# Patient Record
Sex: Female | Born: 1965 | Race: White | Hispanic: No | Marital: Married | Smoking: Current every day smoker
Health system: Southern US, Community
[De-identification: ages and names within clinical notes are randomized; demographics above are authoritative.]

## PROBLEM LIST (undated history)

## (undated) DIAGNOSIS — D239 Other benign neoplasm of skin, unspecified: Secondary | ICD-10-CM

## (undated) DIAGNOSIS — K635 Polyp of colon: Secondary | ICD-10-CM

## (undated) DIAGNOSIS — Z720 Tobacco use: Secondary | ICD-10-CM

## (undated) DIAGNOSIS — R002 Palpitations: Secondary | ICD-10-CM

## (undated) DIAGNOSIS — R232 Flushing: Secondary | ICD-10-CM

## (undated) DIAGNOSIS — G4730 Sleep apnea, unspecified: Secondary | ICD-10-CM

## (undated) DIAGNOSIS — K219 Gastro-esophageal reflux disease without esophagitis: Secondary | ICD-10-CM

## (undated) DIAGNOSIS — J449 Chronic obstructive pulmonary disease, unspecified: Secondary | ICD-10-CM

## (undated) HISTORY — DX: Gastro-esophageal reflux disease without esophagitis: K21.9

## (undated) HISTORY — DX: Tobacco use: Z72.0

## (undated) HISTORY — DX: Palpitations: R00.2

## (undated) HISTORY — PX: BREAST ENHANCEMENT SURGERY: SHX7

## (undated) HISTORY — DX: Sleep apnea, unspecified: G47.30

## (undated) HISTORY — DX: Other benign neoplasm of skin, unspecified: D23.9

## (undated) HISTORY — DX: Chronic obstructive pulmonary disease, unspecified: J44.9

## (undated) HISTORY — DX: Polyp of colon: K63.5

## (undated) HISTORY — PX: BILATERAL TEMPOROMANDIBULAR JOINT ARTHROPLASTY: SUR77

## (undated) HISTORY — DX: Flushing: R23.2

---
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
## 1995-07-29 HISTORY — PX: TUBAL LIGATION: SHX77

## 1998-10-09 ENCOUNTER — Other Ambulatory Visit: Admission: RE | Admit: 1998-10-09 | Discharge: 1998-10-09 | Payer: Self-pay | Admitting: Obstetrics & Gynecology

## 2000-06-15 ENCOUNTER — Other Ambulatory Visit: Admission: RE | Admit: 2000-06-15 | Discharge: 2000-06-15 | Payer: Self-pay | Admitting: Obstetrics and Gynecology

## 2000-10-27 ENCOUNTER — Other Ambulatory Visit: Admission: RE | Admit: 2000-10-27 | Discharge: 2000-10-27 | Payer: Self-pay | Admitting: Obstetrics and Gynecology

## 2001-08-28 HISTORY — PX: COLPOSCOPY: SHX161

## 2001-09-02 ENCOUNTER — Other Ambulatory Visit: Admission: RE | Admit: 2001-09-02 | Discharge: 2001-09-02 | Payer: Self-pay | Admitting: Obstetrics and Gynecology

## 2001-09-25 HISTORY — PX: CERVICAL BIOPSY  W/ LOOP ELECTRODE EXCISION: SUR135

## 2002-02-11 ENCOUNTER — Other Ambulatory Visit: Admission: RE | Admit: 2002-02-11 | Discharge: 2002-02-11 | Payer: Self-pay | Admitting: Obstetrics and Gynecology

## 2002-05-16 ENCOUNTER — Other Ambulatory Visit: Admission: RE | Admit: 2002-05-16 | Discharge: 2002-05-16 | Payer: Self-pay | Admitting: Obstetrics and Gynecology

## 2003-05-10 ENCOUNTER — Other Ambulatory Visit: Admission: RE | Admit: 2003-05-10 | Discharge: 2003-05-10 | Payer: Self-pay | Admitting: Obstetrics and Gynecology

## 2004-04-12 ENCOUNTER — Emergency Department (HOSPITAL_COMMUNITY): Admission: EM | Admit: 2004-04-12 | Discharge: 2004-04-12 | Payer: Self-pay | Admitting: Family Medicine

## 2004-05-07 ENCOUNTER — Other Ambulatory Visit: Admission: RE | Admit: 2004-05-07 | Discharge: 2004-05-07 | Payer: Self-pay | Admitting: Family Medicine

## 2004-06-04 ENCOUNTER — Ambulatory Visit: Payer: Self-pay | Admitting: Family Medicine

## 2005-07-28 HISTORY — PX: RHINOPLASTY: SUR1284

## 2005-12-02 ENCOUNTER — Ambulatory Visit: Payer: Self-pay | Admitting: Family Medicine

## 2005-12-02 ENCOUNTER — Encounter (INDEPENDENT_AMBULATORY_CARE_PROVIDER_SITE_OTHER): Payer: Self-pay | Admitting: *Deleted

## 2005-12-02 ENCOUNTER — Other Ambulatory Visit: Admission: RE | Admit: 2005-12-02 | Discharge: 2005-12-02 | Payer: Self-pay | Admitting: Family Medicine

## 2005-12-09 ENCOUNTER — Ambulatory Visit: Payer: Self-pay | Admitting: Family Medicine

## 2005-12-30 ENCOUNTER — Ambulatory Visit: Payer: Self-pay | Admitting: Family Medicine

## 2006-01-06 ENCOUNTER — Ambulatory Visit: Payer: Self-pay | Admitting: Pulmonary Disease

## 2006-02-10 ENCOUNTER — Ambulatory Visit (HOSPITAL_BASED_OUTPATIENT_CLINIC_OR_DEPARTMENT_OTHER): Admission: RE | Admit: 2006-02-10 | Discharge: 2006-02-10 | Payer: Self-pay | Admitting: Pulmonary Disease

## 2006-02-20 ENCOUNTER — Ambulatory Visit: Payer: Self-pay | Admitting: Pulmonary Disease

## 2006-02-27 ENCOUNTER — Ambulatory Visit: Payer: Self-pay | Admitting: Pulmonary Disease

## 2006-03-10 ENCOUNTER — Other Ambulatory Visit: Admission: RE | Admit: 2006-03-10 | Discharge: 2006-03-10 | Payer: Self-pay | Admitting: Family Medicine

## 2006-03-10 ENCOUNTER — Ambulatory Visit: Payer: Self-pay | Admitting: Family Medicine

## 2006-03-10 ENCOUNTER — Encounter: Payer: Self-pay | Admitting: Family Medicine

## 2006-06-02 ENCOUNTER — Ambulatory Visit (HOSPITAL_BASED_OUTPATIENT_CLINIC_OR_DEPARTMENT_OTHER): Admission: RE | Admit: 2006-06-02 | Discharge: 2006-06-02 | Payer: Self-pay | Admitting: Otolaryngology

## 2006-10-19 ENCOUNTER — Ambulatory Visit: Payer: Self-pay | Admitting: Internal Medicine

## 2006-12-02 ENCOUNTER — Ambulatory Visit: Payer: Self-pay | Admitting: Family Medicine

## 2007-08-27 ENCOUNTER — Emergency Department (HOSPITAL_COMMUNITY): Admission: EM | Admit: 2007-08-27 | Discharge: 2007-08-27 | Payer: Self-pay | Admitting: Emergency Medicine

## 2008-02-24 ENCOUNTER — Encounter (INDEPENDENT_AMBULATORY_CARE_PROVIDER_SITE_OTHER): Payer: Self-pay | Admitting: General Surgery

## 2008-02-24 ENCOUNTER — Ambulatory Visit (HOSPITAL_BASED_OUTPATIENT_CLINIC_OR_DEPARTMENT_OTHER): Admission: RE | Admit: 2008-02-24 | Discharge: 2008-02-24 | Payer: Self-pay | Admitting: General Surgery

## 2008-04-28 ENCOUNTER — Ambulatory Visit: Payer: Self-pay | Admitting: Family Medicine

## 2008-04-28 DIAGNOSIS — F17200 Nicotine dependence, unspecified, uncomplicated: Secondary | ICD-10-CM | POA: Insufficient documentation

## 2008-04-28 DIAGNOSIS — J069 Acute upper respiratory infection, unspecified: Secondary | ICD-10-CM | POA: Insufficient documentation

## 2008-07-07 ENCOUNTER — Emergency Department (HOSPITAL_COMMUNITY): Admission: EM | Admit: 2008-07-07 | Discharge: 2008-07-07 | Payer: Self-pay | Admitting: Family Medicine

## 2009-02-09 ENCOUNTER — Emergency Department (HOSPITAL_COMMUNITY): Admission: EM | Admit: 2009-02-09 | Discharge: 2009-02-09 | Payer: Self-pay | Admitting: Family Medicine

## 2009-05-05 IMAGING — CR DG CHEST 2V
2 series · 2 of 2 positions shown · non-contrast
Comparison: None

CLINICAL DATA: Fever

CHEST - 2 VIEW

[view not recorded (1 of 2)]
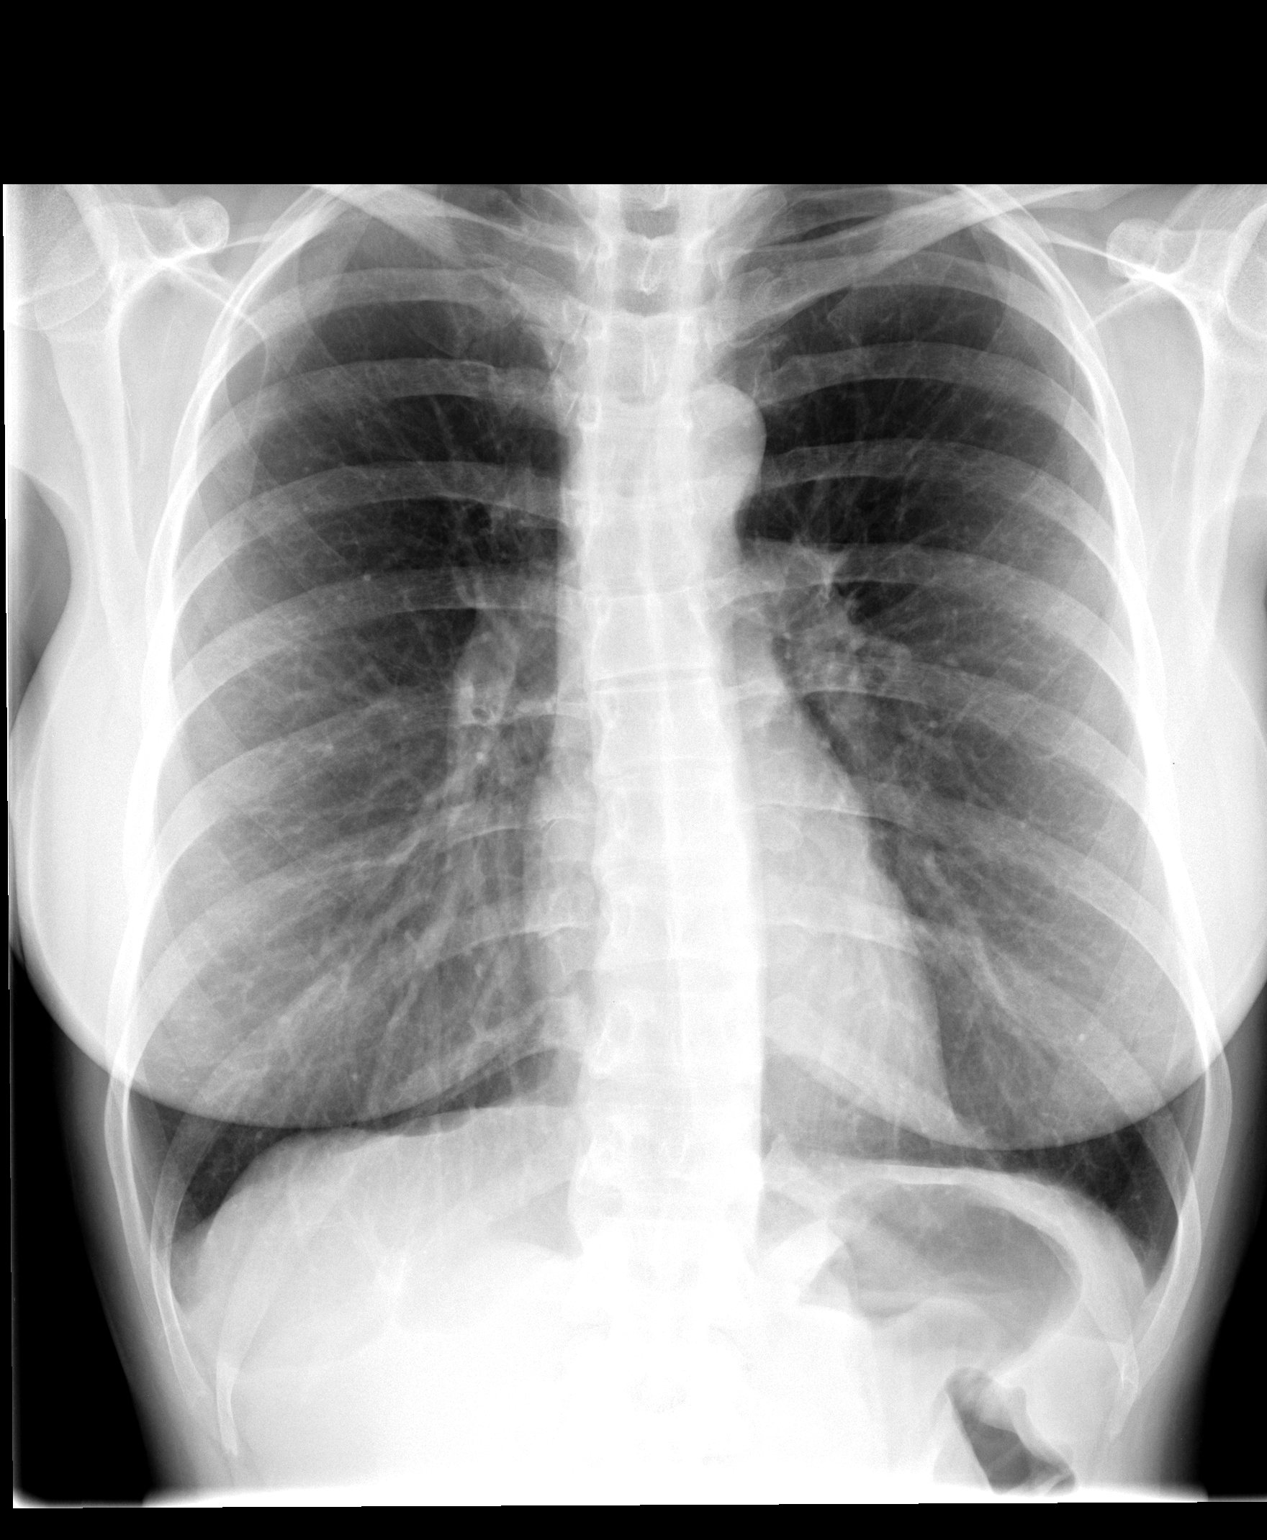

[view not recorded (2 of 2)]
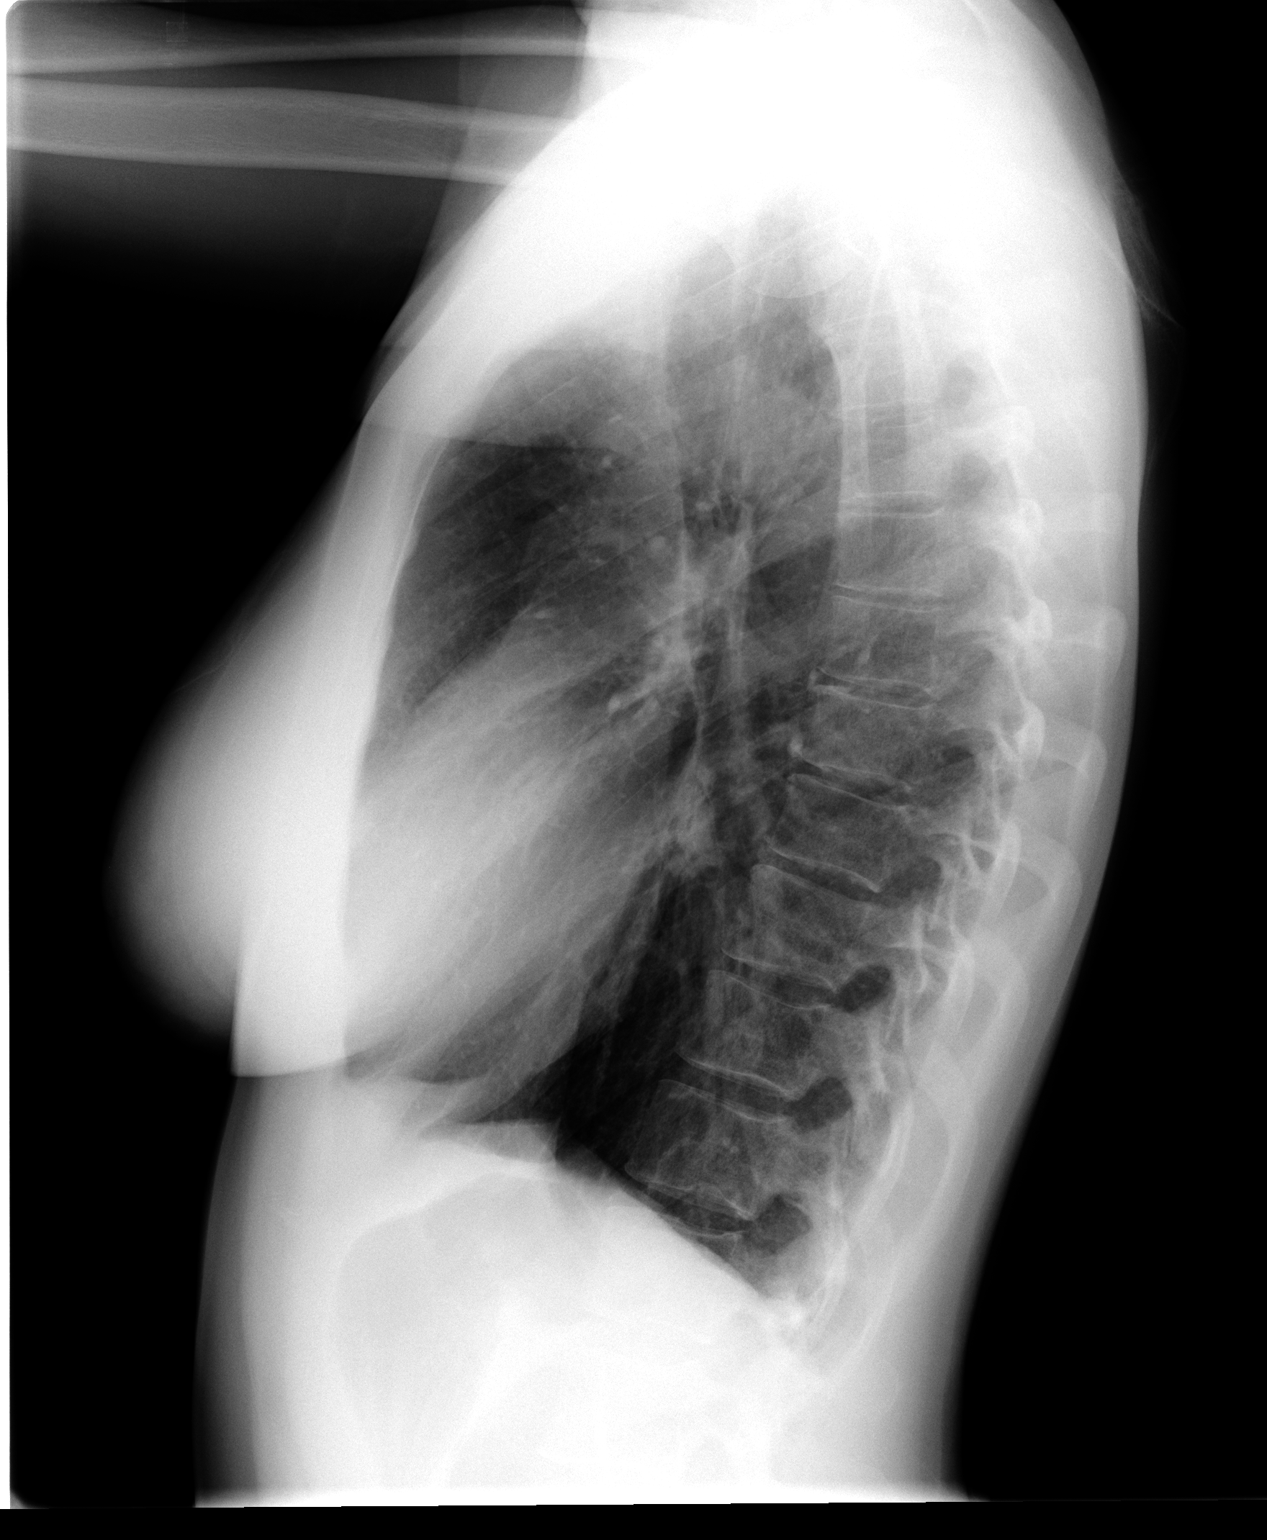

[2 of 2 positions shown; findings below may reference images not displayed]

FINDINGS: The heart size and mediastinal contours are within normal
limits.  Both lungs are clear.  The visualized skeletal structures
are unremarkable. Minimal S-shaped scoliosis of the thoracic spine
noted.
IMPRESSION: No active cardiopulmonary disease.

## 2009-09-18 ENCOUNTER — Other Ambulatory Visit: Admission: RE | Admit: 2009-09-18 | Discharge: 2009-09-18 | Payer: Self-pay | Admitting: Family Medicine

## 2009-09-18 ENCOUNTER — Ambulatory Visit: Payer: Self-pay | Admitting: Family Medicine

## 2009-09-18 LAB — CONVERTED CEMR LAB: Pap Smear: NEGATIVE

## 2009-09-18 LAB — HM PAP SMEAR

## 2009-11-28 ENCOUNTER — Telehealth: Payer: Self-pay | Admitting: Family Medicine

## 2010-01-03 ENCOUNTER — Ambulatory Visit: Payer: Self-pay | Admitting: Family Medicine

## 2010-01-03 DIAGNOSIS — J309 Allergic rhinitis, unspecified: Secondary | ICD-10-CM | POA: Insufficient documentation

## 2010-06-27 ENCOUNTER — Ambulatory Visit: Payer: Self-pay | Admitting: Family Medicine

## 2010-06-27 LAB — CONVERTED CEMR LAB
Basophils Absolute: 0 10*3/uL (ref 0.0–0.1)
HCT: 38.9 % (ref 36.0–46.0)
Hemoglobin: 13.4 g/dL (ref 12.0–15.0)
Lymphs Abs: 1.7 10*3/uL (ref 0.7–4.0)
MCHC: 34.3 g/dL (ref 30.0–36.0)
MCV: 93.8 fL (ref 78.0–100.0)
Monocytes Absolute: 0.5 10*3/uL (ref 0.1–1.0)
Neutro Abs: 1.8 10*3/uL (ref 1.4–7.7)
Platelets: 169 10*3/uL (ref 150.0–400.0)
RDW: 13.2 % (ref 11.5–14.6)

## 2010-07-28 DIAGNOSIS — D239 Other benign neoplasm of skin, unspecified: Secondary | ICD-10-CM

## 2010-07-28 HISTORY — DX: Other benign neoplasm of skin, unspecified: D23.9

## 2010-08-27 NOTE — Progress Notes (Signed)
Summary: sinus  Phone Note Call from Patient   Caller: Patient Call For: Roderick Pee MD Reason for Call: Acute Illness Complaint: Headache Summary of Call: Pt is complaining of bloody nasal discharge, and sinus pressure x 4 days.  No fever.  Would like an antibiotic.  Declines office visit. [facility] ([place]) [phone] Initial call taken by: Lynann Beaver CMA,  Nov 28, 2009 8:42 AM  Follow-up for Phone Call        drink 30 ounces of water daily------- use the combination of afrin nasal spray and saline nasal irrigation with a netti pot at bedtime remind her that there is a 5 night limit on the afrin office visit if the above does not help Follow-up by: Roderick Pee MD,  Nov 28, 2009 8:56 AM  Additional Follow-up for Phone Call Additional follow up Details #1::        Phone Call Completed Additional Follow-up by: Kern Reap CMA Duncan Dull),  Nov 28, 2009 1:30 PM

## 2010-08-27 NOTE — Assessment & Plan Note (Signed)
Summary: ?sinus inf/productive cough/cjr   Vital Signs:  Patient profile:   45 year old female Menstrual status:  regular Weight:      143 pounds Temp:     98.3 degrees F oral BP sitting:   120 / 80  (left arm) Cuff size:   regular  Vitals Entered By: Kern Reap CMA Duncan Dull) (January 03, 2010 12:18 PM) CC: sinus infection   CC:  sinus infection.  History of Present Illness: Amber Zavala is a 45 year old, divorced female, who comes in today with a 6  week history of head congestion, postnasal drip, and cough.  She continues to smoke a pack of cigarettes a day.  Allergies: No Known Drug Allergies  Family History: Reviewed history from 09/18/2009 and no changes required.  father died from lung cancer from smoking mother has lupus one sister in good health  Social History: Reviewed history from 09/18/2009 and no changes required. Occupation: Current Smoker Alcohol use-no Drug use-no Divorced  Review of Systems      See HPI  Physical Exam  General:  Well-developed,well-nourished,in no acute distress; alert,appropriate and cooperative throughout examination Head:  Normocephalic and atraumatic without obvious abnormalities. No apparent alopecia or balding. Eyes:  No corneal or conjunctival inflammation noted. EOMI. Perrla. Funduscopic exam benign, without hemorrhages, exudates or papilledema. Vision grossly normal. Ears:  External ear exam shows no significant lesions or deformities.  Otoscopic examination reveals clear canals, tympanic membranes are intact bilaterally without bulging, retraction, inflammation or discharge. Hearing is grossly normal bilaterally. Nose:  External nasal examination shows no deformity or inflammation. Nasal mucosa are pink and moist without lesions or exudates. Mouth:  Oral mucosa and oropharynx without lesions or exudates.  Teeth in good repair. Neck:  No deformities, masses, or tenderness noted. Chest Wall:  No deformities, masses, or tenderness  noted. Lungs:  Normal respiratory effort, chest expands symmetrically. Lungs are clear to auscultation, no crackles or wheezes.   Problems:  Medical Problems Added: 1)  Dx of Rhinitis  (ICD-477.9)  Impression & Recommendations:  Problem # 1:  TOBACCO ABUSE (ICD-305.1) Assessment Unchanged  Her updated medication list for this problem includes:    Chantix Starting Month Pak 0.5 Mg X 11 & 1 Mg X 42 Misc (Varenicline tartrate) ..... Uad  Orders: Tobacco use cessation intermediate 3-10 minutes (16109)  Problem # 2:  RHINITIS (ICD-477.9) Assessment: New  Orders: Tobacco use cessation intermediate 3-10 minutes (99406)  Complete Medication List: 1)  Chantix Starting Month Pak 0.5 Mg X 11 & 1 Mg X 42 Misc (Varenicline tartrate) .... Uad 2)  Prednisone 20 Mg Tabs (Prednisone) .... Uad  Patient Instructions: 1)  begin the chantix program as outlined. 2)  Begin prednisone two tabs x 3 days, one x 3 days, a half x 3 days, then half a tablet Monday, Wednesday, Friday, for a two week taper. 3)  Return in two weeks for follow-up Prescriptions: CHANTIX STARTING MONTH PAK 0.5 MG X 11 & 1 MG X 42 MISC (VARENICLINE TARTRATE) UAD  #1 x 0   Entered and Authorized by:   Roderick Pee MD   Signed by:   Roderick Pee MD on 01/03/2010   Method used:   Print then Give to Patient   RxID:   [id number] PREDNISONE 20 MG TABS (PREDNISONE) UAD  #30 x 1   Entered and Authorized by:   Roderick Pee MD   Signed by:   Roderick Pee MD on 01/03/2010   Method used:  Print then Give to Patient   RxID:   [id number]

## 2010-08-27 NOTE — Assessment & Plan Note (Signed)
Summary: PAP SMEAR // RS   Vital Signs:  Patient profile:   45 year old female Menstrual status:  regular LMP:     09/07/2009 Height:      68.5 inches Weight:      135 pounds BMI:     20.30 BP sitting:   102 / 70  (left arm) Cuff size:   regular  Vitals Entered By: Kern Reap CMA Duncan Dull) (September 18, 2009 10:51 AM)  History of Present Illness: Amber Zavala is a 45 year old, married female, smoker, one pack per day, who comes in today for physical examination  Last year.  We tried her on a smoking cessation program with chantix however, she never purchased the medicine or tried to program.  She would like to try this year, though.  She gets routine eye care.  Dental care.  Last tetanus booster greater than 10 years, she does not check her breasts monthly, nor did she get annual mammography.  LMP was February, the eighth normal.  For birth control she's had her tubes tied  Allergies (verified): No Known Drug Allergies  Past History:  Past medical, surgical, family and social histories (including risk factors) reviewed, and no changes noted (except as noted below).  Past Medical History: Reviewed history from 04/28/2008 and no changes required. tobacco abuse bilateral tubal ligation  Family History: Reviewed history and no changes required.  father died from lung cancer from smoking mother has lupus one sister in good health  Social History: Reviewed history from 04/28/2008 and no changes required. Occupation: Current Smoker Alcohol use-no Drug use-no Divorced  Review of Systems      See HPI  Physical Exam  General:  Well-developed,well-nourished,in no acute distress; alert,appropriate and cooperative throughout examination Head:  Normocephalic and atraumatic without obvious abnormalities. No apparent alopecia or balding. Eyes:  No corneal or conjunctival inflammation noted. EOMI. Perrla. Funduscopic exam benign, without hemorrhages, exudates or papilledema. Vision  grossly normal. Ears:  External ear exam shows no significant lesions or deformities.  Otoscopic examination reveals clear canals, tympanic membranes are intact bilaterally without bulging, retraction, inflammation or discharge. Hearing is grossly normal bilaterally. Nose:  External nasal examination shows no deformity or inflammation. Nasal mucosa are pink and moist without lesions or exudates. Mouth:  Oral mucosa and oropharynx without lesions or exudates.  Teeth in good repair. Neck:  No deformities, masses, or tenderness noted. Chest Wall:  No deformities, masses, or tenderness noted. Breasts:  bilateral implants no palpable masses Lungs:  Normal respiratory effort, chest expands symmetrically. Lungs are clear to auscultation, no crackles or wheezes. Heart:  Normal rate and regular rhythm. S1 and S2 normal without gallop, murmur, click, rub or other extra sounds. Abdomen:  Bowel sounds positive,abdomen soft and non-tender without masses, organomegaly or hernias noted. Genitalia:  Pelvic Exam:        External: normal female genitalia without lesions or masses        Vagina: normal without lesions or masses        Cervix: normal without lesions or masses        Adnexa: normal bimanual exam without masses or fullness        Uterus: normal by palpation        Pap smear: performed Msk:  No deformity or scoliosis noted of thoracic or lumbar spine.   Pulses:  R and L carotid,radial,femoral,dorsalis pedis and posterior tibial pulses are full and equal bilaterally Extremities:  No clubbing, cyanosis, edema, or deformity noted with normal full range of  motion of all joints.   Neurologic:  No cranial nerve deficits noted. Station and gait are normal. Plantar reflexes are down-going bilaterally. DTRs are symmetrical throughout. Sensory, motor and coordinative functions appear intact. Skin:  total body skin exam normal.  Tattoos on her back..........dark mole, right labia.  No change Cervical Nodes:  No  lymphadenopathy noted Axillary Nodes:  No palpable lymphadenopathy Inguinal Nodes:  No significant adenopathy Psych:  Cognition and judgment appear intact. Alert and cooperative with normal attention span and concentration. No apparent delusions, illusions, hallucinations   Impression & Recommendations:  Problem # 1:  TOBACCO ABUSE (ICD-305.1) Assessment Unchanged  Her updated medication list for this problem includes:    Chantix Starting Month Pak 0.5 Mg X 11 & 1 Mg X 42 Misc (Varenicline tartrate) ..... Uad  Problem # 2:  Preventive Health Care (ICD-V70.0) Assessment: Unchanged  Complete Medication List: 1)  Chantix Starting Month Pak 0.5 Mg X 11 & 1 Mg X 42 Misc (Varenicline tartrate) .... Uad  Other Orders: Tdap => 31yrs IM (16109) Admin 1st Vaccine (60454)  Patient Instructions: 1)  begin the chantix program, as we've outlined.  Return in 3  weeks for follow-up, sooner if any problems. 2)  Purchase over the counter anti-fungal cream...lamicil........ and apply to your toes twice daily for 4 to 6 weeks until the fungal infection is completely gone 3)  Schedule your mammogram./BSE monthly 4)  Please schedule a follow-up appointment in 1 year. Prescriptions: CHANTIX STARTING MONTH PAK 0.5 MG X 11 & 1 MG X 42 MISC (VARENICLINE TARTRATE) UAD  #1 x 0   Entered and Authorized by:   Roderick Pee MD   Signed by:   Roderick Pee MD on 09/18/2009   Method used:   Print then Give to Patient   RxID:   [id number]    Immunizations Administered:  Tetanus Vaccine:    Vaccine Type: Tdap    Site: left deltoid    Mfr: GlaxoSmithKline    Dose: 0.5 ml    Route: IM    Given by: Kern Reap CMA (AAMA)    Exp. Date: 05/23/2010    Lot #: [id number]    Physician counseled: yes

## 2010-12-10 NOTE — Op Note (Signed)
Amber Zavala, Amber Zavala            ACCOUNT NO.:  [id number]   MEDICAL RECORD NO.:  [id number]          PATIENT TYPE:  AMB   LOCATION:  [facility]                         [facility]ACILITY[facility]  [facility]   PHYSICIAN:  Juanetta Gosling, MDDATE OF BIRTH:  1966/02/27   DATE OF PROCEDURE:  02/24/2008  DATE OF DISCHARGE:                               OPERATIVE REPORT   PREOPERATIVE DIAGNOSIS:  Left groin cyst.   POSTOPERATIVE DIAGNOSIS:  Left groin cyst.   PROCEDURE PERFORMED:  Left groin cyst excision and excision of chronic  wound.   SURGEON:  Juanetta Gosling, MD   ASSISTANT:  None.   ANESTHESIA:  General.   ESTIMATED BLOOD LOSS:  Minimal.   SPECIMENS:  Left groin cyst to pathology.   COMPLICATIONS:  None.   DRAINS:  None.   INDICATIONS:  This is a 45 year old female with about 1 year history of  a left groin cyst who underwent an incision and drainage of this area  approximately 1 year ago, but since then has been remained with an  opened wound and continued drainage from this area.  She presents for  treatment.   PROCEDURE:  After informed consent was obtained, the patient was taken  to the operating room where she was placed under general anesthesia  without complication.  She was administered 1 g of IV Ancef prior to  surgery.  Her left groin was then prepped and draped in the standard,  sterile, surgical fashion.  Approximately 1.5 cm elliptical incision was  around the mass and the chronic wound was then made and dissection was  then carried out down to the level below this mass, which was then  removed with the electrocautery.  The base of this was cleaned.  This  was passed off the table as a specimen.  Hemostasis was obtained.  Irrigation was performed.  Deep Vicryl stitch was  then placed to approximate the dermal tissues and then 4-0 Prolene  interrupted fashion was used to close the wound.  Sterile dressing was  then applied.  She tolerated this well and was transferred to  the  recovery room in stable condition.      Juanetta Gosling, MD  Electronically Signed     MCW/MEDQ  D:  02/24/2008  T:  02/25/2008  Job:  [id number]

## 2010-12-13 NOTE — Op Note (Signed)
Amber Zavala, Amber Zavala            ACCOUNT NO.:  [id number]   MEDICAL RECORD NO.:  [id number]          PATIENT TYPE:  AMB   LOCATION:  [facility]                          FACILITY:  [facility]   PHYSICIAN:  Lucky Cowboy, MD         DATE OF BIRTH:  [birth year]   DATE OF PROCEDURE:  06/02/2006  DATE OF DISCHARGE:                                 OPERATIVE REPORT   PREOPERATIVE DIAGNOSIS:  Obstructive sleep apnea with left septal deviation  and obstructing bilateral inferior turbinate hypertrophy.   POSTOPERATIVE DIAGNOSIS:  Obstructive sleep apnea with left septal deviation  and obstructing bilateral inferior turbinate hypertrophy.   PROCEDURE:  Septoplasty, bilateral inferior turbinate reductions.   SURGEON:  Lucky Cowboy, M.D.   ANESTHESIA:  General endotracheal anesthesia.   ESTIMATED BLOOD LOSS:  Less than 20 mL.   SPECIMENS:  None.   COMPLICATIONS:  None.   INDICATIONS:  The patient is a 45 year old female with a greater than five  year history of nasal congestion, snoring and fatigue.  She underwent a  sleep study on February 03, 2006, which revealed moderate obstructive sleep  apnea with an RDI of 25.  Maximum oxygen desaturation was noted to be 86%.  There is left greater than right nasal congestion.  There is a concern with  the ability to tolerate the CPAP due to nasal obstruction.  For this reason,  septoplasty along with bilateral inferior turbinate reductions are  performed.   FINDINGS:  The patient was noted to have a severe left septal deviation with  obstructing bony and mucosa bilateral inferior turbinate hypertrophy.  There  was bowing to the cartilaginous septum towards the left.   DESCRIPTION OF PROCEDURE:  The patient was taken to the operating room and  placed on the table in the supine position.  She was then placed under  general endotracheal anesthesia and the table rotated counter clockwise 90  degrees.  The neck was gently extended.  The face was prepped with  Betadine  and draped in the usual sterile fashion.  Each of the nasal cavities was  decongested with Afrin on cottonoid pledgets.  1% lidocaine with a 1:100,000  of epinephrine was used to inject both sides of the nasal septum.  The  inferior turbinates were also injected.  Left hemitransfixion incision was  made using a #15 blade and submucoperichondrial mucoperiosteal flaps  elevated using the [employer].  The bony cartilaginous  junction was divided using a Therapist, nutritional and contralateral  submucoperiosteal flaps elevated.  The posterior portion of the septum was  divided moderately high using an open [employer] forceps.  This took  down the posterior portion of the vomer and perpendicular plate of the  ethmoid bone.  The inferior bony spur to the left was also taken down in  this fashion and also with Takahashi forceps.  The bony cartilaginous  junction and the maxillary crest was dislocated using a Therapist, nutritional.  A  deviation of the inferior septum to the left was taken down by trimming an  approximately 2 mm strip with a 15 blade.  Once this was performed, the  septum was moved over to the right.  There was some narrowing of the right  nostril which was treated using a tacking stitch of 4-0 Monocryl undyed.  This then tacked the medial crura of the lower lateral cartilage in the  septum more toward the left.  The same suture was then used to secure the  septum to the maxillary crest.  The incision was then reapproximated using a  simple interrupted fashion with 4-0 chromic.  A horizontal mattress stitch  was applied using 4-0 chromic, as well.  Once this was performed, both of  the inferior turbinates were reduced while visualizing the nasal cavities  with a 0 degrees [employer] endoscope.  The microdebrider was then used  to remove the inferior one half of the inferior turbinate and bone was taken  down using the through cut forceps.  Suction cautery was  used for  hemostasis.  Each of the nasal cavities was then packed with Bactroban  coated Telfa packs which were tied to one another anterior to the columella.  The oral cavity was suctioned.  The table was rotated clockwise 90 degrees  to its original position.  The patient was awakened from anesthesia and  taken to the post anesthesia care unit in stable condition.  There were no  complications.      Lucky Cowboy, MD  Electronically Signed     SJ/MEDQ  D:  06/02/2006  T:  06/02/2006  Job:  [id number]   cc:   Barbaraann Share, MD,FCCP

## 2010-12-13 NOTE — Procedures (Signed)
Amber Zavala, MONICAL NO.:  [id number]   MEDICAL RECORD NO.:  [id number]          PATIENT TYPE:  OUT   LOCATION:  SLEEP CENTER                 FACILITY:  [facility]   PHYSICIAN:  Marcelyn Bruins, M.D. [facility] DATE OF BIRTH:  1966-03-29   DATE OF STUDY:  02/10/2006                              NOCTURNAL POLYSOMNOGRAM   INDICATION FOR STUDY:  Hypersomnia with sleep apnea.   EPWORTH SLEEPINESS SCORE:  Is 6.   MEDICATIONS:   SLEEP ARCHITECTURE:  The patient had a total sleep time of 401 minutes with  adequate slow wave sleep but decreased REM.  Sleep onset latency was  prolonged of 41 minutes and REM onset was normal.  Sleep efficiency was  decreased at 85%.   RESPIRATORY DATA:  The patient was found to have 141 hypopneas and 29 apneas  for a respiratory disturbance index of 25 events per hour.  The events were  not positional but there was moderate to loud snoring noted throughout.   OXYGEN DATA:  There was O2 desaturation as low as 91% with the patient's  obstructive events.   CARDIAC DATA:  No clinically significant cardiac arrhythmias   MOVEMENT-PARASOMNIA:  The patient was found to have 226 leg jerks with 3.7  per hour resulting in arousal or awakening.   IMPRESSIONS-RECOMMENDATIONS:  1.  Moderate obstructive sleep apnea/hypopnea syndrome with a respiratory      disturbance index of 25 events per hour and oxygen desaturation as low      as 91%.  Treatment for this degree of sleep apnea can include weight      loss alone if applicable, upper airway surgery, oral appliance, and also      CPAP.  2.  Very large numbers of leg jerks with what appears to be significant      sleep disruption.  It is unclear how much this is related to the      patient's sleep disordered breathing, or whether she may have a      concomitant movement      disorder of sleep.  Clinical correlation is suggested after the patient      has been treated adequately for her sleep disordered  breathing.           ______________________________  Marcelyn Bruins, M.D. [facility]  Diplomate, American Board of Sleep  Medicine     KC/MEDQ  D:  02/20/2006 16:03:46  T:  02/20/2006 16:10:96  Job:  [id number]

## 2010-12-19 ENCOUNTER — Encounter: Payer: Self-pay | Admitting: Family Medicine

## 2010-12-27 ENCOUNTER — Encounter: Payer: Self-pay | Admitting: Family Medicine

## 2010-12-30 ENCOUNTER — Ambulatory Visit (INDEPENDENT_AMBULATORY_CARE_PROVIDER_SITE_OTHER): Payer: PRIVATE HEALTH INSURANCE | Admitting: Family Medicine

## 2010-12-30 ENCOUNTER — Other Ambulatory Visit (HOSPITAL_COMMUNITY)
Admission: RE | Admit: 2010-12-30 | Discharge: 2010-12-30 | Disposition: A | Discharge status: Home / Self Care | Payer: PRIVATE HEALTH INSURANCE | Source: Ambulatory Visit | Attending: Family Medicine | Admitting: Family Medicine

## 2010-12-30 ENCOUNTER — Encounter: Payer: Self-pay | Admitting: Family Medicine

## 2010-12-30 DIAGNOSIS — Z720 Tobacco use: Secondary | ICD-10-CM

## 2010-12-30 DIAGNOSIS — Z01419 Encounter for gynecological examination (general) (routine) without abnormal findings: Secondary | ICD-10-CM | POA: Insufficient documentation

## 2010-12-30 DIAGNOSIS — G2581 Restless legs syndrome: Secondary | ICD-10-CM

## 2010-12-30 DIAGNOSIS — F17200 Nicotine dependence, unspecified, uncomplicated: Secondary | ICD-10-CM

## 2010-12-30 DIAGNOSIS — Z0000 Encounter for general adult medical examination without abnormal findings: Secondary | ICD-10-CM

## 2010-12-30 MED ORDER — ROPINIROLE HCL 0.5 MG PO TABS: ORAL_TABLET | ORAL | Status: DC

## 2010-12-30 MED ORDER — VARENICLINE TARTRATE 1 MG PO TABS: 1.0000 mg | ORAL_TABLET | Freq: Two times a day (BID) | ORAL | Status: AC

## 2010-12-30 NOTE — Progress Notes (Signed)
  Subjective:    Patient ID: Amber Zavala, female    DOB: 03-14-1966, 45 y.o.   MRN: [id number]  HPIMichelle is a 45 year old single,,,,,,,, assumed to be remarried,,,,,,,,, female smoker, who comes in today for general physical examination  She has always been in good, health she's had no chronic health problems except for the tobacco abuse.  Last year.  We outlined a program, but she never got started.  She would like to try a smoking cessation program now.  Her social history is that she is getting remarried.  Her husband did be his first wife committed suicide.  They have a 27 and a 37-year-old child.  She does not do BSE monthly, nor has she ever had a mammogram and encouraged her to do both.  She has had a lot of sun exposure    Review of Systems  HENT: Positive for hearing loss.        Objective:   Physical Exam  Constitutional: She appears well-developed and well-nourished.  HENT:  Head: Normocephalic and atraumatic.  Right Ear: External ear normal.  Left Ear: External ear normal.  Nose: Nose normal.  Mouth/Throat: Oropharynx is clear and moist.  Eyes: EOM are normal. Pupils are equal, round, and reactive to light.  Neck: Normal range of motion. Neck supple. No thyromegaly present.  Cardiovascular: Normal rate, regular rhythm, normal heart sounds and intact distal pulses.  Exam reveals no gallop and no friction rub.   No murmur heard. Pulmonary/Chest: Effort normal and breath sounds normal.  Abdominal: Soft. Bowel sounds are normal. She exhibits no distension and no mass. There is no tenderness. There is no rebound.  Genitourinary: Vagina normal and uterus normal. Guaiac negative stool. No vaginal discharge found.       Bilateral breast implants.  No palpable abnormalities  Musculoskeletal: Normal range of motion.  Lymphadenopathy:    She has no cervical adenopathy.  Neurological: She is alert. She has normal reflexes. No cranial nerve deficit. She exhibits normal  muscle tone. Coordination normal.  Skin: Skin is warm and dry.       3 abnormal lesions on her back with black pigment  Psychiatric: She has a normal mood and affect. Her behavior is normal. Judgment and thought content normal.          Assessment & Plan:  Healthy female.  Tobacco abuse began the smoking cessation program.  Follow-up in two weeks.  Abnormal moles x 3 removed in 3 weeks.  BSE monthly and get your first mammogram.  Chronic sun damage.  Recommend sunscreens SPF 50.  History of RLS ,,,requip  Nightly.

## 2010-12-30 NOTE — Patient Instructions (Signed)
Beginning the smoking cessation program by taking one half tab daily in the morning.  Used earwax dissolving drops only in your left ear.  Return in 3 weeks for a 30 minute appointment for removal of ear wax, mole removal, and follow-up on the smoking cessation program.  Sunscreens SPF 50+!!!!!!!!!!!!!!!!!!!!!!!.  Call and get set up for a mammogram

## 2011-01-20 ENCOUNTER — Encounter: Payer: Self-pay | Admitting: Family Medicine

## 2011-01-20 ENCOUNTER — Ambulatory Visit (INDEPENDENT_AMBULATORY_CARE_PROVIDER_SITE_OTHER): Payer: PRIVATE HEALTH INSURANCE | Admitting: Family Medicine

## 2011-01-20 DIAGNOSIS — D235 Other benign neoplasm of skin of trunk: Secondary | ICD-10-CM | POA: Insufficient documentation

## 2011-01-20 DIAGNOSIS — L989 Disorder of the skin and subcutaneous tissue, unspecified: Secondary | ICD-10-CM

## 2011-01-20 NOTE — Progress Notes (Signed)
  Subjective:    Patient ID: Amber Zavala, female    DOB: 29-Aug-1965, 45 y.o.   MRN: [id number]  HPI Amber Zavala is a 45 year old female, who comes in today for removal of 3 black lesions off her back.  The first lesion is a 5 mm x 5 mm lesion at the midline.  T10.  The second lesion is 5 mm x 5 mm to the left.  Number one.  The third lesion is 3 mm x 3 mm right lower back.  After informed consent.  All 3 lesions were cleaned with alcohol, and we used 1% Xylocaine with epinephrine.  The lesions were excised with 3-mm margins sent for pathologic analysis.  The bases were cauterized.  Band-Aids were applied.  She left the office in good condition.  Suspect all 3 lesions are probably dysplastic nevi.  Rule out melanoma   Review of Systems    Negative Objective:   Physical Exam    See above    Assessment & Plan:  Lesions removed x 3.  Probable dysplastic nevi rule out melanoma lesion sent for pathologic analysis

## 2011-01-23 ENCOUNTER — Other Ambulatory Visit: Payer: Self-pay | Admitting: Family Medicine

## 2011-01-23 DIAGNOSIS — D235 Other benign neoplasm of skin of trunk: Secondary | ICD-10-CM

## 2011-02-10 ENCOUNTER — Ambulatory Visit: Payer: PRIVATE HEALTH INSURANCE | Admitting: Family Medicine

## 2011-03-03 ENCOUNTER — Telehealth: Payer: Self-pay | Admitting: *Deleted

## 2011-03-03 ENCOUNTER — Encounter: Payer: Self-pay | Admitting: *Deleted

## 2011-03-03 NOTE — Telephone Encounter (Signed)
Left message on machine for patient  To call back to schedule an office visit.  A certified letter will be sent to home address.

## 2011-03-05 ENCOUNTER — Telehealth: Payer: Self-pay | Admitting: *Deleted

## 2011-03-05 NOTE — Telephone Encounter (Signed)
I spoke with patient and informed her that she needs to return to the office for a follow up skin check.  patient  Understood and said she will call back for an appointment.

## 2011-04-25 LAB — DIFFERENTIAL
Basophils Absolute: 0
Basophils Relative: 1
Eosinophils Relative: 1
Lymphocytes Relative: 40
Monocytes Absolute: 0.5
Neutro Abs: 2.1

## 2011-04-25 LAB — PREGNANCY, URINE: Preg Test, Ur: NEGATIVE

## 2011-04-25 LAB — BASIC METABOLIC PANEL
BUN: 17
CO2: 31
Calcium: 9.7
GFR calc non Af Amer: >60
Glucose, Bld: 85
Sodium: 141

## 2011-04-25 LAB — CBC
HCT: 41.2
Hemoglobin: 14
MCHC: 33.9
Platelets: 209
RDW: 13.8

## 2011-12-11 ENCOUNTER — Other Ambulatory Visit (HOSPITAL_COMMUNITY)
Admission: RE | Admit: 2011-12-11 | Discharge: 2011-12-11 | Disposition: A | Discharge status: Home / Self Care | Payer: PRIVATE HEALTH INSURANCE | Source: Ambulatory Visit | Attending: Family Medicine | Admitting: Family Medicine

## 2011-12-11 ENCOUNTER — Ambulatory Visit (INDEPENDENT_AMBULATORY_CARE_PROVIDER_SITE_OTHER): Payer: PRIVATE HEALTH INSURANCE | Admitting: Family Medicine

## 2011-12-11 ENCOUNTER — Encounter: Payer: Self-pay | Admitting: Family Medicine

## 2011-12-11 VITALS — BP 110/80 | Temp 98.1°F | Wt 142.0 lb

## 2011-12-11 DIAGNOSIS — Z01419 Encounter for gynecological examination (general) (routine) without abnormal findings: Secondary | ICD-10-CM | POA: Insufficient documentation

## 2011-12-11 DIAGNOSIS — F17200 Nicotine dependence, unspecified, uncomplicated: Secondary | ICD-10-CM

## 2011-12-11 DIAGNOSIS — N951 Menopausal and female climacteric states: Secondary | ICD-10-CM | POA: Insufficient documentation

## 2011-12-11 DIAGNOSIS — G4730 Sleep apnea, unspecified: Secondary | ICD-10-CM

## 2011-12-11 MED ORDER — VARENICLINE TARTRATE 1 MG PO TABS: ORAL_TABLET | ORAL | Status: DC

## 2011-12-11 MED ORDER — ETHYNODIOL DIAC-ETH ESTRADIOL 1-35 MG-MCG PO TABS: 1.0000 | ORAL_TABLET | Freq: Every day | ORAL | Status: DC

## 2011-12-11 NOTE — Progress Notes (Signed)
Addended by: Kern Reap B on: 12/11/2011 11:28 AM   Modules accepted: Orders

## 2011-12-11 NOTE — Patient Instructions (Signed)
Start the [facility] tonight  Carollee Herter takes one half tablet daily in the morning and begin a tapering program,,,,,,,,,, by cutting back every week as follows  15 for one week, 10 for one week, 5 for one week, then 4, 3, 2, 1, 0  Followup in August for your annual exam sooner if any problems  Take an aspirin tablet daily

## 2011-12-11 NOTE — Progress Notes (Signed)
  Subjective:    Patient ID: Amber Zavala, female    DOB: [birth year], 46 y.o.   MRN: [id number]  HPI Amber Zavala is a 46 year old divorced female who is getting remarried June 22 2 comes in today for evaluation of night sweats hot flashes sleep apnea and tobacco abuse  Her night sweats started about a month ago. Her periods have changed a little bit are still regular  She continues to smoke a pack of cigarettes daily. She was informed about the side effects of hormone replacement therapy and nicotine. She's reluctant to try to stop smoking  We had a long discussion about smoking cessation and giving a trial of Chantix  She also has a history of sleep apnea and has been treated in the past by ENT and has had nasal surgery. This was done by Norton Pastel. She's also had a sleep study. She would like to followup on that also.   Review of Systems Gen. gynecologic review of systems otherwise negative   general Objective:   Physical Exam  Well-developed well-nourished female in no acute distress abdominal exam negative pelvic examination external genitalia within normal limits vaginal vault was normal Pap smear was done bimanual exam normal      Assessment & Plan:    Perimenopausal,,,,,,,,,, plan low-dose BCPs aspirin  Tobacco abuse she agrees to taper with the Chantix  History of sleep apnea followup by Dr. Mellody Dance

## 2012-01-01 ENCOUNTER — Ambulatory Visit (INDEPENDENT_AMBULATORY_CARE_PROVIDER_SITE_OTHER): Payer: PRIVATE HEALTH INSURANCE | Admitting: Pulmonary Disease

## 2012-01-01 ENCOUNTER — Encounter: Payer: Self-pay | Admitting: Pulmonary Disease

## 2012-01-01 VITALS — BP 124/68 | HR 76 | Temp 98.5°F | Ht 68.5 in | Wt 148.0 lb

## 2012-01-01 DIAGNOSIS — G4733 Obstructive sleep apnea (adult) (pediatric): Secondary | ICD-10-CM

## 2012-01-01 NOTE — Patient Instructions (Signed)
Will start on cpap at moderate pressure level.  Please call if having tolerance issues. followup with me in 5 weeks.

## 2012-01-01 NOTE — Progress Notes (Signed)
Subjective:    Patient ID: Amber Zavala, female    DOB: 13-Aug-1965, 46 y.o.   MRN: [id number]  HPI The patient is a 46 year old female who lives in asked to see for snoring and daytime sleepiness.  She has been seen in the past, with a sleep study in 2007 showing an AHI of 25 events per hour consistent with moderate obstructive sleep apnea.  She was also noted to have large numbers of periodic limb movements with significant sleep disruption.  The decision was made to treat her movement disorder of sleep first, and she was started on Requip as a trial.  She states this helped her leg jerks, but did not like the way it made her feel.  She only took for 2-3 nights, and discontinued.  She never followed up or called me thereafter.  She is continuing to have loud snoring, and has been noted to have an abnormal breathing pattern during sleep.  She has frequent awakenings during the night, and is not rested in the mornings upon arising.  She is very frustrated about her significant sleep disruption.  The patient notes definite sleep pressure during the day, but occurs primarily in the afternoons.  She describes mild sleep pressure with driving at times, but will fall asleep very easily as a passenger coming home from work.  She states her weight is up 10 pounds over the last 2 years, and her Epworth score today is 5.  Sleep Questionnaire: What time do you typically go to bed?( Between what hours) 9 pm How long does it take you to fall asleep? 30 minutes How many times during the night do you wake up? 10 What time do you get out of bed to start your day? 0500 Do you drive or operate heavy machinery in your occupation? No How much has your weight changed (up or down) over the past two years? (In pounds) 10 lb (4.536 kg) Have you ever had a sleep study before? Yes If yes, location of study? [facility]? If yes, date of study? 2007 Do you currently use CPAP? No Do you wear oxygen at any time? No    Review of  Systems  Constitutional: Negative.  Negative for fever and unexpected weight change.  HENT: Negative.  Negative for ear pain, nosebleeds, congestion, sore throat, rhinorrhea, sneezing, trouble swallowing, dental problem, postnasal drip and sinus pressure.   Eyes: Negative.  Negative for redness and itching.  Respiratory: Negative.  Negative for cough, chest tightness, shortness of breath and wheezing.   Cardiovascular: Negative.  Negative for palpitations and leg swelling.  Gastrointestinal: Negative.  Negative for nausea and vomiting.  Genitourinary: Negative.  Negative for dysuria.  Musculoskeletal: Negative.  Negative for joint swelling.  Skin: Negative.  Negative for rash.  Neurological: Negative.  Negative for headaches.  Hematological: Negative.  Does not bruise/bleed easily.  Psychiatric/Behavioral: Negative.  Negative for dysphoric mood. The patient is not nervous/anxious.        Objective:   Physical Exam Constitutional:  Well developed, no acute distress  HENT:  Nares patent without discharge, but narrowed bilat.   Oropharynx without exudate, palate and uvula are elongated posteriorly   Eyes:  Perrla, eomi, no scleral icterus  Neck:  No JVD, no TMG  Cardiovascular:  Normal rate, regular rhythm, no rubs or gallops.  No murmurs        Intact distal pulses  Pulmonary :  Normal breath sounds, no stridor or respiratory distress   No rales, rhonchi, or  wheezing  Abdominal:  Soft, nondistended, bowel sounds present.  No tenderness noted.   Musculoskeletal:  No lower extremity edema noted.  Lymph Nodes:  No cervical lymphadenopathy noted  Skin:  No cyanosis noted  Neurologic:  Alert, appropriate, moves all 4 extremities without obvious deficit.         Assessment & Plan:

## 2012-01-01 NOTE — Assessment & Plan Note (Signed)
The patient has a history of moderate sleep apnea, but also periodic limb movements.  At the last visit in 2007, she was treated with a dopamine agonist for her legs to see if her sleep would improve.  She really did not take the medication for more than a few days, and therefore the question remains on answered.  The patient is very frustrated with her sleep at this time, and is very tired.  I think we should take a different approach this time around, and start with treatment of her sleep apnea.  Her leg movements may resolve with treatment of her sleep disordered breathing.  The patient is agreeable to this approach. I will set the patient up on cpap at a moderate pressure level to allow for desensitization, and will troubleshoot the device over the next 4-6weeks if needed.  The pt is to call me if having issues with tolerance.  Will then optimize the pressure once patient is able to wear cpap on a consistent basis.

## 2012-01-05 ENCOUNTER — Emergency Department (INDEPENDENT_AMBULATORY_CARE_PROVIDER_SITE_OTHER)
Admission: EM | Admit: 2012-01-05 | Discharge: 2012-01-05 | Disposition: A | Discharge status: Home / Self Care | Payer: PRIVATE HEALTH INSURANCE | Source: Home / Self Care | Attending: Emergency Medicine | Admitting: Emergency Medicine

## 2012-01-05 ENCOUNTER — Encounter (HOSPITAL_COMMUNITY): Payer: Self-pay | Admitting: Emergency Medicine

## 2012-01-05 DIAGNOSIS — J029 Acute pharyngitis, unspecified: Secondary | ICD-10-CM

## 2012-01-05 MED ORDER — AMOXICILLIN 500 MG PO CAPS: 500.0000 mg | ORAL_CAPSULE | Freq: Three times a day (TID) | ORAL | Status: AC

## 2012-01-05 NOTE — Discharge Instructions (Signed)

## 2012-01-05 NOTE — ED Notes (Signed)
PT HERE WITH SORE THROAT AND DRY COUGH THAT STARTED X3 DYS AGO.LOW GRADE TEMP LAST FRI AT HOME.DENIES CHILLS,FEVER,N,V,D.TAKING IBUPROFEN FOR PAIN

## 2012-01-05 NOTE — ED Provider Notes (Signed)
History     CSN: [id number]  Arrival date & time 01/05/12  1358   First MD Initiated Contact with Patient 01/05/12 1506      Chief Complaint  Patient presents with  . Sore Throat    (Consider location/radiation/quality/duration/timing/severity/associated sxs/prior treatment) Patient is a 46 y.o. female presenting with pharyngitis. The history is provided by the patient. No language interpreter was used.  Sore Throat This is a new problem. The current episode started more than 2 days ago. The problem occurs constantly. The problem has been gradually worsening. The symptoms are aggravated by nothing. The symptoms are relieved by nothing. She has tried nothing for the symptoms.  Pt complains of a sorethroat and cough.   Pt has had some congestion and drainge.    Past Medical History  Diagnosis Date  . Tobacco abuse   . S/P tubal ligation     bilateral     Past Surgical History  Procedure Date  . Appendectomy   . Rhinoplasty 2007    Family History  Problem Relation Age of Onset  . Lupus Mother   . Arthritis Mother   . Lung cancer Father     History  Substance Use Topics  . Smoking status: Current Everyday Smoker -- 1.0 packs/day for 30 years    Types: Cigarettes  . Smokeless tobacco: Not on file  . Alcohol Use: Yes     4 times a week    OB History    Grav Para Term Preterm Abortions TAB SAB Ect Mult Living                  Review of Systems  HENT: Positive for sore throat.   Respiratory: Positive for cough.   All other systems reviewed and are negative.    Allergies  Review of patient's allergies indicates no known allergies.  Home Medications  No current outpatient prescriptions on file.  BP 106/72  Pulse 78  Temp(Src) 98.8 F (37.1 C) (Oral)  Resp 18  SpO2 98%  LMP 01/04/2012  Physical Exam  Nursing note and vitals reviewed. Constitutional: She is oriented to person, place, and time. She appears well-developed and well-nourished.  HENT:    Head: Normocephalic and atraumatic.  Eyes: Pupils are equal, round, and reactive to light.  Neck: Normal range of motion. Neck supple.  Cardiovascular: Normal rate and normal heart sounds.   Pulmonary/Chest: Effort normal.  Abdominal: Soft.  Musculoskeletal: Normal range of motion.  Neurological: She is alert and oriented to person, place, and time. She has normal reflexes.  Skin: Skin is warm.  Psychiatric: She has a normal mood and affect.    ED Course  Procedures (including critical care time)  Labs Reviewed - No data to display No results found.   No diagnosis found.    MDM  Rx for amoxicillian         Lonia Skinner Robinson, [place] 01/05/12 1531

## 2012-01-05 NOTE — ED Notes (Signed)
Pt waiting evaluation by md - denies needs at this time

## 2012-01-05 NOTE — ED Provider Notes (Signed)
Medical screening examination/treatment/procedure(s) were performed by non-physician practitioner and as supervising physician I was immediately available for consultation/collaboration.  Caley Volkert, M.D.   Atha Muradyan C Ryah Cribb, MD 01/05/12 1637 

## 2012-01-07 ENCOUNTER — Telehealth: Payer: Self-pay | Admitting: Pulmonary Disease

## 2012-01-07 NOTE — Telephone Encounter (Signed)
Will forward msg to Dr. Shelle Iron so he is aware of this.

## 2012-01-07 NOTE — Telephone Encounter (Signed)
Noted  

## 2012-01-15 ENCOUNTER — Encounter: Payer: Self-pay | Admitting: Pulmonary Disease

## 2012-02-03 ENCOUNTER — Ambulatory Visit: Payer: PRIVATE HEALTH INSURANCE | Admitting: Pulmonary Disease

## 2012-03-05 ENCOUNTER — Ambulatory Visit (INDEPENDENT_AMBULATORY_CARE_PROVIDER_SITE_OTHER): Payer: Commercial Managed Care - PPO | Admitting: Family Medicine

## 2012-03-05 ENCOUNTER — Encounter: Payer: Self-pay | Admitting: Family Medicine

## 2012-03-05 VITALS — BP 132/80 | HR 82 | Temp 98.6°F | Wt 150.0 lb

## 2012-03-05 DIAGNOSIS — M7660 Achilles tendinitis, unspecified leg: Secondary | ICD-10-CM

## 2012-03-05 MED ORDER — ETHYNODIOL DIAC-ETH ESTRADIOL 1-50 MG-MCG PO TABS: 1.0000 | ORAL_TABLET | Freq: Every day | ORAL | Status: DC

## 2012-03-05 NOTE — Progress Notes (Signed)
  Subjective:    Patient ID: Amber Zavala, female    DOB: [birth year], 46 y.o.   MRN: [id number]  HPI Here with one week of pain in the back of the left lower leg, and she is worried she may have a blood clot. No SOB or chest pains. She has been on BCP for the past 3 months to help with menopausal hot flashes, and this has helped. She is a smoker also, but she has been taking 81 mg of aspirin daily. No recent trauma. No swelling in the leg or foot.    Review of Systems  Constitutional: Negative.   Respiratory: Negative.   Cardiovascular: Negative.   Musculoskeletal: Positive for myalgias.       Objective:   Physical Exam  Constitutional: She appears well-developed and well-nourished.  Musculoskeletal:       Tender along the posterior left lower leg just above the Achilles tendon, no calf tenderness. No swelling, no erythema or warmth. Homans negative           Assessment & Plan:  Achilles tendonitis. Reassured her this is not a blood clot. Rest, Advil, ice packs. Recheck prn

## 2012-03-08 ENCOUNTER — Encounter: Payer: PRIVATE HEALTH INSURANCE | Admitting: Family Medicine

## 2012-03-23 ENCOUNTER — Other Ambulatory Visit: Payer: Self-pay | Admitting: Family Medicine

## 2012-03-25 ENCOUNTER — Telehealth: Payer: Self-pay | Admitting: Family Medicine

## 2012-03-25 MED ORDER — ETHYNODIOL DIAC-ETH ESTRADIOL 1-50 MG-MCG PO TABS: 1.0000 | ORAL_TABLET | Freq: Every day | ORAL | Status: DC

## 2012-03-25 NOTE — Telephone Encounter (Signed)
Pt has sch an ov for 06/08/12 and is needing a refill of ethynodiol-ethinyl estradiol (ZOVIA) 1-50 MG-MCG tablet to last until her appt date. Pls call in to [facility] on [address].

## 2012-04-27 ENCOUNTER — Other Ambulatory Visit: Payer: Self-pay | Admitting: Family Medicine

## 2012-04-27 DIAGNOSIS — Z1231 Encounter for screening mammogram for malignant neoplasm of breast: Secondary | ICD-10-CM

## 2012-05-25 ENCOUNTER — Ambulatory Visit
Admission: RE | Admit: 2012-05-25 | Discharge: 2012-05-25 | Disposition: A | Discharge status: Home / Self Care | Payer: Commercial Managed Care - PPO | Source: Ambulatory Visit | Attending: Family Medicine | Admitting: Family Medicine

## 2012-05-25 DIAGNOSIS — Z1231 Encounter for screening mammogram for malignant neoplasm of breast: Secondary | ICD-10-CM

## 2012-05-27 ENCOUNTER — Ambulatory Visit (INDEPENDENT_AMBULATORY_CARE_PROVIDER_SITE_OTHER): Payer: Commercial Managed Care - PPO | Admitting: Family Medicine

## 2012-05-27 ENCOUNTER — Encounter (INDEPENDENT_AMBULATORY_CARE_PROVIDER_SITE_OTHER): Payer: Commercial Managed Care - PPO

## 2012-05-27 ENCOUNTER — Encounter: Payer: Self-pay | Admitting: Family Medicine

## 2012-05-27 VITALS — BP 110/70 | Temp 98.3°F

## 2012-05-27 DIAGNOSIS — F17200 Nicotine dependence, unspecified, uncomplicated: Secondary | ICD-10-CM

## 2012-05-27 DIAGNOSIS — N951 Menopausal and female climacteric states: Secondary | ICD-10-CM

## 2012-05-27 DIAGNOSIS — M79604 Pain in right leg: Secondary | ICD-10-CM

## 2012-05-27 DIAGNOSIS — M79609 Pain in unspecified limb: Secondary | ICD-10-CM

## 2012-05-27 MED ORDER — WARFARIN SODIUM 5 MG PO TABS: ORAL_TABLET | ORAL | Status: DC

## 2012-05-27 NOTE — Patient Instructions (Addendum)
Stop the birth control pills  Begin Coumadin as outlined,,,,,,,, 2 tabs daily,,,,,,,, always at bedtime,,,,,,,, x4 days then 1 tab daily  Restart the Chantix one half tab daily  Decrease her cigarette consumption to 15 cigarettes daily  Followup on Tuesday

## 2012-05-27 NOTE — Progress Notes (Signed)
  Subjective:    Patient ID: Amber Zavala, female    DOB: [birth year], 46 y.o.   MRN: [id number]  HPI Amber Zavala is a 46 year old single female smoker one pack of cigarettes a day who comes in today for evaluation of right leg pain  She states she noticed about a week ago without any history of trauma pain in the posterior portion of her right leg just above the calf. She's not had any swelling chest pain shortness of breath or episodes of rapid heart rate. She's never had blood clots in the past.  Family history negative for DVT and factor V Leiden  She continues to smoke 20 cigarettes a day. She did try the Chantix for a while and it did help but then she quit taking the medication.  She's had a BTL she's taken her BCPs because of premature menopause   Review of Systems Gen. review of systems otherwise negative    Objective:   Physical Exam Well-developed well-nourished female no acute distress examination of both lower extremities they appear normal Homans sign is negative there is some mild tenderness in the posterior portion of the right gastroc just below the knee around the popliteal fossa no increase in size pulses normal cardiopulmonary exam normal  Doppler shows a clot in the lesser saphenous vein on that left lower extremity       Assessment & Plan:  Superficial versus DVT right lower extremity plan DC hormones, begin Coumadin, followup in 5 days, restart the Chantix program and smoking cessation

## 2012-06-01 ENCOUNTER — Ambulatory Visit (INDEPENDENT_AMBULATORY_CARE_PROVIDER_SITE_OTHER): Payer: Commercial Managed Care - PPO | Admitting: Family Medicine

## 2012-06-01 ENCOUNTER — Encounter: Payer: Self-pay | Admitting: Family Medicine

## 2012-06-01 DIAGNOSIS — I8001 Phlebitis and thrombophlebitis of superficial vessels of right lower extremity: Secondary | ICD-10-CM

## 2012-06-01 DIAGNOSIS — I8000 Phlebitis and thrombophlebitis of superficial vessels of unspecified lower extremity: Secondary | ICD-10-CM

## 2012-06-01 NOTE — Patient Instructions (Signed)
Stop the Coumadin  Motrin 600 mg twice daily elevation heat when necessary return in one week sooner if any problem

## 2012-06-01 NOTE — Progress Notes (Signed)
  Subjective:    Patient ID: Amber Zavala, female    DOB: Mar 20, 1966, 46 y.o.   MRN: [id number]  HPI Amber Zavala is a 46 year old single female smoker who comes in today for followup of phlebitis  We saw her last week with pain in her right leg below the knee. We sent her for a ultrasound. The report on the ultrasound said there was clot in the saphenous vein. I interpreted that is a DVT however the written report came back a superficial clot in the lesser saphenous vein. We started her on Coumadin over the weekend. She comes back today for followup. She stopped her birth control pills she continues to smoke.   Review of Systems Review of systems negative    Objective:   Physical Exam  Well-developed well-nourished female no acute distress examination of her leg shows no palpable tenderness or swelling      Assessment & Plan:  Superficial phlebitis right leg plan Motrin

## 2012-06-08 ENCOUNTER — Encounter: Payer: Self-pay | Admitting: Family Medicine

## 2012-06-08 ENCOUNTER — Other Ambulatory Visit (HOSPITAL_COMMUNITY)
Admission: RE | Admit: 2012-06-08 | Discharge: 2012-06-08 | Disposition: A | Discharge status: Home / Self Care | Payer: Commercial Managed Care - PPO | Source: Ambulatory Visit | Attending: Family Medicine | Admitting: Family Medicine

## 2012-06-08 ENCOUNTER — Ambulatory Visit (INDEPENDENT_AMBULATORY_CARE_PROVIDER_SITE_OTHER): Payer: Commercial Managed Care - PPO | Admitting: Family Medicine

## 2012-06-08 VITALS — BP 120/80

## 2012-06-08 DIAGNOSIS — I8001 Phlebitis and thrombophlebitis of superficial vessels of right lower extremity: Secondary | ICD-10-CM

## 2012-06-08 DIAGNOSIS — N951 Menopausal and female climacteric states: Secondary | ICD-10-CM

## 2012-06-08 DIAGNOSIS — Z01419 Encounter for gynecological examination (general) (routine) without abnormal findings: Secondary | ICD-10-CM

## 2012-06-08 DIAGNOSIS — F17200 Nicotine dependence, unspecified, uncomplicated: Secondary | ICD-10-CM

## 2012-06-08 DIAGNOSIS — I8000 Phlebitis and thrombophlebitis of superficial vessels of unspecified lower extremity: Secondary | ICD-10-CM

## 2012-06-08 DIAGNOSIS — J309 Allergic rhinitis, unspecified: Secondary | ICD-10-CM

## 2012-06-08 MED ORDER — CLONIDINE HCL 0.1 MG PO TABS: ORAL_TABLET | ORAL | Status: DC

## 2012-06-08 NOTE — Addendum Note (Signed)
Addended by: Kern Reap B on: 06/08/2012 12:25 PM   Modules accepted: Orders

## 2012-06-08 NOTE — Progress Notes (Signed)
  Subjective:    Patient ID: Amber Zavala, female    DOB: 05/01/1966, 46 y.o.   MRN: [id number]  HPI Amber Zavala is a 46 year old single female smoker who comes in today for general physical examination  We had outlined a smoking cessation program with the Chantix however she's not purchase the medication yet and she continues to smoke. She understands the negative consequences however she's not ready to try to quit yet  We had to take her off her BCPs which she had taken for premature menopause because she developed a superficial clot in her right leg. She says her leg is pain-free now. She's having mild to moderate hot flashes. We'll give her course of clonidine  Recent. Normal not sure she has a retained tampon.  She's had a recent mammogram which is normal   Review of Systems  Constitutional: Negative.   HENT: Negative.   Eyes: Negative.   Respiratory: Negative.   Cardiovascular: Negative.   Gastrointestinal: Negative.   Genitourinary: Negative.   Musculoskeletal: Negative.   Neurological: Negative.   Hematological: Negative.   Psychiatric/Behavioral: Negative.        Objective:   Physical Exam  Constitutional: She appears well-developed and well-nourished.  HENT:  Head: Normocephalic and atraumatic.  Right Ear: External ear normal.  Left Ear: External ear normal.  Nose: Nose normal.  Mouth/Throat: Oropharynx is clear and moist.  Eyes: EOM are normal. Pupils are equal, round, and reactive to light.  Neck: Normal range of motion. Neck supple. No thyromegaly present.  Cardiovascular: Normal rate, regular rhythm, normal heart sounds and intact distal pulses.  Exam reveals no gallop and no friction rub.   No murmur heard. Pulmonary/Chest: Effort normal and breath sounds normal.  Abdominal: Soft. Bowel sounds are normal. She exhibits no distension and no mass. There is no tenderness. There is no rebound.  Genitourinary: Vagina normal and uterus normal. Guaiac negative  stool. No vaginal discharge found.       Bilateral breast exam normal no retained tampon bilateral implants  Musculoskeletal: Normal range of motion.  Lymphadenopathy:    She has no cervical adenopathy.  Neurological: She is alert. She has normal reflexes. No cranial nerve deficit. She exhibits normal muscle tone. Coordination normal.  Skin: Skin is warm and dry.       Total body skin exam normal  Psychiatric: She has a normal mood and affect. Her behavior is normal. Judgment and thought content normal.          Assessment & Plan:  Healthy female  Her biggest ongoing risk factors for continued smoking however she's not ready to quit  Superficial thrombophlebitis resolved  Post menopausal hot flashes trial of clonidine followup in 2 months  History of dysplastic nevus he normal skin exam today

## 2012-06-08 NOTE — Patient Instructions (Signed)
Begin the clonidine one tablet daily at bedtime return in 2 months for followup sooner if any problems  Strongly consider the Chantix program as we discussed

## 2012-09-15 ENCOUNTER — Telehealth: Payer: Self-pay | Admitting: Family Medicine

## 2012-09-15 NOTE — Telephone Encounter (Signed)
Patient Information:  Caller Name: Milee  Phone: [phone]  Patient: Amber Zavala, Amber Zavala  Gender: Female  DOB: [birth year]  Age: 47 Years  PCP: Kelle [facility]ing [facility])  Pregnant: No  Office Follow Up:  Does the office need to follow up with this patient?: Yes  Instructions For The Office: See within 4 hours' disposition krs/can  RN Note:  States chest pain is intermittent and stabbing.  States had history of two blood clots in leg, but was not treated with blood thinners.  States "was a surface thing, and not deep vein thrombosis."  Has smoked x 30 years.  Denies emergent symptoms currently; disposition see in office within 4 hours.  No appts available per Epic; info to office for staff/provider appt need workin/management.  May reach patient at [phone].  krs/can  Symptoms  Reason For Call & Symptoms: cough; states has had 6-week history of sharp stabbing pain in center of chest  Reviewed Health History In EMR: Yes  Reviewed Medications In EMR: Yes  Reviewed Allergies In EMR: Yes  Reviewed Surgeries / Procedures: Yes  Date of Onset of Symptoms: 07/28/2012 OB / GYN:  LMP: Unknown  Guideline(s) Used:  Chest Pain  Disposition Per Guideline:   See Today in Office  Reason For Disposition Reached:   Intermittent chest pains persist > 3 days  Advice Given:  Fleeting Chest Pain:  Fleeting chest pains that last only a few seconds and then go away are generally not serious. They may be from pinched muscles or nerves in your chest wall.

## 2012-09-15 NOTE — Telephone Encounter (Signed)
Appointment made and patient is aware

## 2012-09-16 ENCOUNTER — Encounter: Payer: Self-pay | Admitting: Family Medicine

## 2012-09-16 ENCOUNTER — Ambulatory Visit (INDEPENDENT_AMBULATORY_CARE_PROVIDER_SITE_OTHER): Payer: Commercial Managed Care - PPO | Admitting: Family Medicine

## 2012-09-16 VITALS — BP 110/80 | Temp 98.6°F | Wt 148.0 lb

## 2012-09-16 DIAGNOSIS — J069 Acute upper respiratory infection, unspecified: Secondary | ICD-10-CM

## 2012-09-16 DIAGNOSIS — R079 Chest pain, unspecified: Secondary | ICD-10-CM

## 2012-09-16 DIAGNOSIS — R071 Chest pain on breathing: Secondary | ICD-10-CM

## 2012-09-16 DIAGNOSIS — R0789 Other chest pain: Secondary | ICD-10-CM | POA: Insufficient documentation

## 2012-09-16 DIAGNOSIS — F17200 Nicotine dependence, unspecified, uncomplicated: Secondary | ICD-10-CM

## 2012-09-16 MED ORDER — HYDROCODONE-HOMATROPINE 5-1.5 MG/5ML PO SYRP: 5.0000 mL | ORAL_SOLUTION | Freq: Three times a day (TID) | ORAL | Status: DC | PRN

## 2012-09-16 NOTE — Patient Instructions (Signed)
Hydromet,,,,,, 1/2-1 teaspoon each bedtime when necessary for cough and cold  Drink lots of water  Begin the Chantix one half tab Monday Wednesday Friday for one week then one half tab daily  Begin to taper by 5 per week  Followup in 2 weeks  Motrin 600 mg twice daily when necessary for chest wall pain

## 2012-09-16 NOTE — Progress Notes (Signed)
  Subjective:    Patient ID: Amber Zavala, female    DOB: 18-May-1966, 47 y.o.   MRN: [id number]  HPI Van is a 46 year old divorced female smoker one pack per day who comes in with a two-month history of chest wall pain and a cold for 3 days  For the past 2 months she's had intermittent chest wall pain. She says sometimes dull sometimes sharp she points the left lateral area of her breasts as a source for pain. She does have implants. Recent mammogram normal. The pain lasts for a few seconds and goes away. It's not associated with exertion. No cardiac or pulmonary symptoms. No history of trauma.  She's had a viral syndrome for couple days with head congestion sore throat   She gets out of by the Chantix but she hasn't are taken the medication. She would like to try   Review of Systems Review of systems otherwise negative EKG normal    Objective:   Physical Exam Well-developed and nourished female no acute distress cardiopulmonary exam normal left breast normal there's no palpable tenderness the chest wall no masses axillary exam normal ENT normal       Assessment & Plan:  Chest wall pain reassured  Viral syndrome plan treat symptomatically

## 2013-04-06 ENCOUNTER — Other Ambulatory Visit: Payer: Self-pay | Admitting: Family Medicine

## 2013-04-06 DIAGNOSIS — D509 Iron deficiency anemia, unspecified: Secondary | ICD-10-CM

## 2013-04-18 ENCOUNTER — Other Ambulatory Visit (INDEPENDENT_AMBULATORY_CARE_PROVIDER_SITE_OTHER): Payer: Self-pay

## 2013-04-18 DIAGNOSIS — D509 Iron deficiency anemia, unspecified: Secondary | ICD-10-CM

## 2013-04-18 LAB — CBC
MCHC: 34.3 g/dL (ref 30.0–36.0)
MCV: 93.8 fl (ref 78.0–100.0)

## 2013-07-28 DIAGNOSIS — K635 Polyp of colon: Secondary | ICD-10-CM

## 2013-07-28 HISTORY — DX: Polyp of colon: K63.5

## 2013-07-28 HISTORY — PX: COLONOSCOPY W/ BIOPSIES: SHX1374

## 2013-10-05 ENCOUNTER — Telehealth: Payer: Self-pay | Admitting: Family Medicine

## 2013-10-05 NOTE — Telephone Encounter (Signed)
Please add patient on schedule tomorrow 3/12.

## 2013-10-05 NOTE — Telephone Encounter (Signed)
Pt scheduled 3/12 @12pm 

## 2013-10-05 NOTE — Telephone Encounter (Signed)
Pt state her upper abdomen swollen, no bowel movement in a week, pt states she feels really bad and wants to see dr. Sherren Mocha tomorrow. Ok to schedule same day? Pt declined triage and states she rather see dr. Sherren Mocha.

## 2013-10-05 NOTE — Telephone Encounter (Signed)
Spoke to pt asked when she moved her bowels last? Pt said went earlier this morning, took laxative on Monday, but still swollen in upper abdomen and is concerned. Told pt okay will have scheduling call her back and put her on for tomorrow. Pt verbalized understanding.

## 2013-10-06 ENCOUNTER — Encounter: Payer: Self-pay | Admitting: Family Medicine

## 2013-10-06 ENCOUNTER — Ambulatory Visit (INDEPENDENT_AMBULATORY_CARE_PROVIDER_SITE_OTHER): Payer: Self-pay | Admitting: Family Medicine

## 2013-10-06 VITALS — BP 110/74 | HR 75 | Temp 97.8°F | Resp 18 | Ht 68.5 in | Wt 160.0 lb

## 2013-10-06 DIAGNOSIS — K5900 Constipation, unspecified: Secondary | ICD-10-CM

## 2013-10-06 DIAGNOSIS — N951 Menopausal and female climacteric states: Secondary | ICD-10-CM

## 2013-10-06 NOTE — Progress Notes (Signed)
   Subjective:    Patient ID: Amber Zavala, female    DOB: 1966-04-19, 48 y.o.   MRN: [id number]  HPI Ross is a 48 year old single female...... X. smoker x8 weeks.Marland KitchenMarland KitchenMarland KitchenMarland Kitchen who comes in today for evaluation of constipation  She states about 3 weeks ago she is some fullness in her upper abdomen under both ribs. She also describes a sensation of bloating. Since that time she's had her with constipation. Her last normal bowel is about 5 days ago. She's tried over-the-counter stool softeners to no avail. She's had no fever chills nausea or vomiting.  Negative family history for colon disease  LMP was 5 months ago she is now having menopausal symptoms dry skin dry vagina and hot flushes. Negative family history for ovarian cancer   Review of Systems    negative review of systems Objective:   Physical Exam Well-developed well-nourished female no acute distress vital signs stable she is afebrile examination the abdomen abdomen is soft the bowel sounds are normal there is no palpable masses or tenderness. Liver spleen kidneys not enlarged. Rectal normal appearing rectum digital rectal exam shows normal rectum stool guaiac-negative       Assessment & Plan:  Three-week history of constipation........ GI consult rule out GI pathology always keep in mind ovarian cancer presenting with constipation and bloating

## 2013-10-06 NOTE — Patient Instructions (Signed)
Drink lots of water  Milk of magnesia 3 tablespoons twice daily  MiraLax one daily at bedtime  We will get you set up with a GI consult ASAP  Congratulations on not smoking for 8 weeks............ keep up the good work

## 2013-10-06 NOTE — Progress Notes (Signed)
Pre-visit discussion using our clinic review tool. No additional management support is needed unless otherwise documented below in the visit note.  

## 2013-10-07 ENCOUNTER — Telehealth: Payer: Self-pay | Admitting: Family Medicine

## 2013-10-07 NOTE — Telephone Encounter (Signed)
Relevant patient education mailed to patient.  

## 2013-10-10 ENCOUNTER — Encounter: Payer: Self-pay | Admitting: *Deleted

## 2013-10-11 ENCOUNTER — Ambulatory Visit (INDEPENDENT_AMBULATORY_CARE_PROVIDER_SITE_OTHER): Payer: Self-pay | Admitting: Internal Medicine

## 2013-10-11 ENCOUNTER — Encounter: Payer: Self-pay | Admitting: Internal Medicine

## 2013-10-11 VITALS — BP 118/70 | HR 64 | Ht 68.5 in | Wt 159.7 lb

## 2013-10-11 DIAGNOSIS — R195 Other fecal abnormalities: Secondary | ICD-10-CM

## 2013-10-11 DIAGNOSIS — R198 Other specified symptoms and signs involving the digestive system and abdomen: Secondary | ICD-10-CM

## 2013-10-11 MED ORDER — MOVIPREP 100 G PO SOLR: 1.0000 | Freq: Once | ORAL | Status: DC

## 2013-10-11 NOTE — Progress Notes (Signed)
Amber Zavala 1965-07-29 [id number]  Note: This dictation was prepared with Dragon digital system. Any transcriptional errors that result from this procedure are unintentional.   History of Present Illness:  This is a 48 year old white female with a sudden change in bowel habits. She has constipation poorly responsive to laxatives. She has gained about 10 pounds in the past year. She has been perimenopausal. Last period was 6 months ago. She denies rectal bleeding. There is no family history of colon cancer. Her usual bowel habits are one bowel movement every day. She is now complaining of abdominal distention and bloating with some watery stools after she took a laxative last week.    Past Medical History  Diagnosis Date  . Tobacco abuse   . S/P tubal ligation     bilateral   . Sleep apnea     Past Surgical History  Procedure Laterality Date  . Appendectomy    . Rhinoplasty  2007  . Breast enhancement surgery      No Known Allergies  Family history and social history have been reviewed.  Review of Systems:   The remainder of the 10 point ROS is negative except as outlined in the H&P  Physical Exam: General Appearance Well developed, in no distress Eyes  Non icteric  HEENT  Non traumatic, normocephalic  Mouth No lesion, tongue papillated, no cheilosis Neck Supple without adenopathy, thyroid not enlarged, no carotid bruits, no JVD Lungs Clear to auscultation bilaterally COR Normal S1, normal S2, regular rhythm, no murmur, quiet precordium Abdomen good muscular support. No distention. Normal active bowel sounds. Mild discomfort in the left and right lower quadrants. Rectal small amount of heme positive stool , normal external hemorrhoids. Extremities  No pedal edema Skin No lesions Neurological Alert and oriented x 3 Psychological Normal mood and affect  Assessment and Plan:   Problem #1 Trace heme positive stool on my exam today and change in bowel habits. She is  perimenopausal. She has no risk factors for colon cancer. She is not eating enough fiber based on my conversation with her, so I advised her to start taking Benefiber and take an over-the-counter probiotic to regulate her bowel habits. We will proceed with  colonoscopy to evaluate the heme positive stool. I gave her a choice of colonoscopy if repeat stool cards arepositive or at age 35 is tool cards are negative.She prefers to go ahead with colonoscopy.to clarify the issue.    Amber Zavala 10/11/2013

## 2013-10-11 NOTE — Patient Instructions (Addendum)
You have been scheduled for a colonoscopy with propofol. Please follow written instructions given to you at your visit today.  Please pick up your prep kit at the pharmacy within the next 1-3 days. If you use inhalers (even only as needed), please bring them with you on the day of your procedure. Your physician has requested that you go to www.startemmi.com and enter the access code given to you at your visit today. This web site gives a general overview about your procedure. However, you should still follow specific instructions given to you by our office regarding your preparation for the procedure.  Dr Sherren Mocha

## 2013-10-21 ENCOUNTER — Encounter: Payer: Self-pay | Admitting: Internal Medicine

## 2013-10-21 ENCOUNTER — Ambulatory Visit (AMBULATORY_SURGERY_CENTER): Payer: Self-pay | Admitting: Internal Medicine

## 2013-10-21 VITALS — BP 123/75 | HR 58 | Temp 96.3°F | Resp 16 | Ht 68.5 in | Wt 159.0 lb

## 2013-10-21 DIAGNOSIS — D126 Benign neoplasm of colon, unspecified: Secondary | ICD-10-CM

## 2013-10-21 DIAGNOSIS — R195 Other fecal abnormalities: Secondary | ICD-10-CM

## 2013-10-21 MED ORDER — SODIUM CHLORIDE 0.9 % IV SOLN: 500.0000 mL | INTRAVENOUS | Status: DC

## 2013-10-21 NOTE — Patient Instructions (Signed)
YOU HAD AN ENDOSCOPIC PROCEDURE TODAY AT THE [facility]: Refer to the procedure report that was given to you for any specific questions about what was found during the examination.  If the procedure report does not answer your questions, please call your gastroenterologist to clarify.  If you requested that your care partner not be given the details of your procedure findings, then the procedure report has been included in a sealed envelope for you to review at your convenience later.  YOU SHOULD EXPECT: Some feelings of bloating in the abdomen. Passage of more gas than usual.  Walking can help get rid of the air that was put into your GI tract during the procedure and reduce the bloating. If you had a lower endoscopy (such as a colonoscopy or flexible sigmoidoscopy) you may notice spotting of blood in your stool or on the toilet paper. If you underwent a bowel prep for your procedure, then you may not have a normal bowel movement for a few days.  DIET: Your first meal following the procedure should be a light meal and then it is ok to progress to your normal diet.  A half-sandwich or bowl of soup is an example of a good first meal.  Heavy or fried foods are harder to digest and may make you feel nauseous or bloated.  Likewise meals heavy in dairy and vegetables can cause extra gas to form and this can also increase the bloating.  Drink plenty of fluids but you should avoid alcoholic beverages for 24 hours.  ACTIVITY: Your care partner should take you home directly after the procedure.  You should plan to take it easy, moving slowly for the rest of the day.  You can resume normal activity the day after the procedure however you should NOT DRIVE or use heavy machinery for 24 hours (because of the sedation medicines used during the test).    SYMPTOMS TO REPORT IMMEDIATELY: A gastroenterologist can be reached at any hour.  During normal business hours, 8:30 AM to 5:00 PM Monday through Friday,  call [phone].  After hours and on weekends, please call the GI answering service at [phone] who will take a message and have the physician on call contact you.   Following lower endoscopy (colonoscopy or flexible sigmoidoscopy):  Excessive amounts of blood in the stool  Significant tenderness or worsening of abdominal pains  Swelling of the abdomen that is new, acute  Fever of 100F or higher    FOLLOW UP: If any biopsies were taken you will be contacted by phone or by letter within the next 1-3 weeks.  Call your gastroenterologist if you have not heard about the biopsies in 3 weeks.  Our staff will call the home number listed on your records the next business day following your procedure to check on you and address any questions or concerns that you may have at that time regarding the information given to you following your procedure. This is a courtesy call and so if there is no answer at the home number and we have not heard from you through the emergency physician on call, we will assume that you have returned to your regular daily activities without incident.  SIGNATURES/CONFIDENTIALITY: You and/or your care partner have signed paperwork which will be entered into your electronic medical record.  These signatures attest to the fact that that the information above on your After Visit Summary has been reviewed and is understood.  Full responsibility of the confidentiality   of this discharge information lies with you and/or your care-partner.   Information on polyps given to you today  Heme occult slide cards to check stool for blood given to you today ,wait a week before do these as instructed

## 2013-10-21 NOTE — Progress Notes (Signed)
Procedure ends, to recovery, report given and VSS. 

## 2013-10-21 NOTE — Progress Notes (Signed)
Called to room to assist during endoscopic procedure.  Patient ID and intended procedure confirmed with present staff. Received instructions for my participation in the procedure from the performing physician.  

## 2013-10-21 NOTE — Op Note (Signed)
[facility]  [address] [place], [id number]   COLONOSCOPY PROCEDURE REPORT  PATIENT: Amber Zavala, Amber Zavala  MR#: [id number] BIRTHDATE: [birth year] , 47  yrs. old GENDER: Female ENDOSCOPIST: Lafayette Dragon, MD REFERRED XB:[id number] Delora Fuel, M.D. PROCEDURE DATE:  10/21/2013 PROCEDURE:   Colonoscopy with snare polypectomy First Screening Colonoscopy - Avg.  risk and is 50 yrs.  old or older Yes.  Prior Negative Screening - Now for repeat screening. N/A  History of Adenoma - Now for follow-up colonoscopy & has been > or = to 3 yrs.  N/A  Polyps Removed Today? Yes. ASA CLASS:   Class I INDICATIONS:heme-positive stool. MEDICATIONS: MAC sedation, administered by CRNA and Propofol (Diprivan) 360 mg IV  DESCRIPTION OF PROCEDURE:   After the risks benefits and alternatives of the procedure were thoroughly explained, informed consent was obtained.  A digital rectal exam revealed no abnormalities of the rectum.   The LB [id number]  endoscope was introduced through the anus and advanced to the cecum, which was identified by both the appendix and ileocecal valve. No adverse events experienced.   The quality of the prep was good, using MoviPrep  The instrument was then slowly withdrawn as the colon was fully examined.      COLON FINDINGS: A smooth sessile polyp ranging between 3-75mm in size was found in the sigmoid colon.  A polypectomy was performed with a cold snare.  The resection was complete and the polyp tissue was completely retrieved.  Retroflexed views revealed no abnormalities. The time to cecum=5 minutes 44 seconds.  Withdrawal time=8 minutes 10 seconds.  The scope was withdrawn and the procedure completed. COMPLICATIONS: There were no complications.  ENDOSCOPIC IMPRESSION: Sessile polyp ranging between 3-48mm in size was found in the sigmoid colon; polypectomy was performed with a cold snare  RECOMMENDATIONS: 1.  Await pathology results 2.  high fiber  diet hemoccult cards recall colon pending path report   eSigned:  Lafayette Dragon, MD 10/21/2013 8:28 AM   cc:   PATIENT NAME:  Amber Zavala, Amber Zavala MR#: [id number]

## 2013-10-24 ENCOUNTER — Telehealth: Payer: Self-pay | Admitting: *Deleted

## 2013-10-24 NOTE — Telephone Encounter (Signed)
  Follow up Call-  Call back number 10/21/2013  Post procedure Call Back phone  # [phone]  Permission to leave phone message Yes    [facility]

## 2013-10-25 ENCOUNTER — Encounter: Payer: Self-pay | Admitting: Internal Medicine

## 2013-10-26 ENCOUNTER — Encounter: Payer: Self-pay | Admitting: *Deleted

## 2014-07-31 ENCOUNTER — Ambulatory Visit (INDEPENDENT_AMBULATORY_CARE_PROVIDER_SITE_OTHER): Payer: Self-pay | Admitting: Podiatry

## 2014-07-31 ENCOUNTER — Encounter: Payer: Self-pay | Admitting: Podiatry

## 2014-07-31 VITALS — BP 124/84 | HR 76 | Resp 16 | Ht 68.0 in | Wt 150.0 lb

## 2014-07-31 DIAGNOSIS — L600 Ingrowing nail: Secondary | ICD-10-CM

## 2014-07-31 NOTE — Patient Instructions (Signed)

## 2014-07-31 NOTE — Progress Notes (Signed)
   Subjective:    Patient ID: Amber Zavala, female    DOB: Aug 20, 1965, 49 y.o.   MRN: [id number]  HPI Comments: i have an ingrown toenail on my left big toe. Ive had it for it least 6 months. Its getting worse. Its painful. At times it hurts to walk, stand, wear shoes and bed sheets will bother it. i have tried to cut it out.   This patient presents complaining of a painful lateral margin of the left hallux nail. The medial margin left hallux toenail was excised for permanent correction on 01/24/2012. The right hallux had undergone phenol matricectomy medial lateral margins on 10/21/2011.   Review of Systems  All other systems reviewed and are negative.      Objective:   Physical Exam  Orientated 3 patient presents with her husband  Vascular: DP pulses 2/4 bilaterally PT pulses 2/4 bilaterally Capillary reflex immediate bilaterally  Neurological: Ankle reflex equal and reactive bilaterally  Dermatological: The lateral margin of the left hallux toenails incurvated and tender to palpation The medial margin left hallux toenail as partial regrowth, however, is not tender to palpation The medial lateral borders the right hallux nail are narrowed from previous phenol matricectomy's  Musculoskeletal The second and third toes bilaterally are webbed        Assessment & Plan:   Assessment: Ingrowing lateral margin of left hallux toenail  Plan: Offered patient phenol matricectomy, permanent correction lateral margin left hallux toenail and she verbally consents.  The left hallux was blocked with 3 mL of 50-50 mixture of 2% plain Xylocaine and 0.5% plain Marcaine. The Left hallux is painted with Betadine and exsanguinated. The lateral margin of left hallux toenail was excised and a phenol matricectomy performed. The wound was washed with alcohol and an antibiotic compression dressing applied. The tourniquet was released and spontaneous capillary fill time noted in the left  hallux. Postoperative oral and written instructions provided  Patient tolerated procedure without a difficulty and over-the-counter NSAID recommended if needed for pain control.  Reappoint at patient's request

## 2014-08-01 ENCOUNTER — Encounter: Payer: Self-pay | Admitting: Podiatry

## 2014-09-18 ENCOUNTER — Ambulatory Visit (INDEPENDENT_AMBULATORY_CARE_PROVIDER_SITE_OTHER): Payer: Self-pay | Admitting: Family Medicine

## 2014-09-18 ENCOUNTER — Encounter: Payer: Self-pay | Admitting: Family Medicine

## 2014-09-18 VITALS — BP 110/80 | Temp 98.1°F | Wt 169.0 lb

## 2014-09-18 DIAGNOSIS — Z87898 Personal history of other specified conditions: Secondary | ICD-10-CM

## 2014-09-18 MED ORDER — PREDNISONE 20 MG PO TABS: ORAL_TABLET | ORAL | Status: DC

## 2014-09-18 NOTE — Progress Notes (Signed)
   Subjective:    Patient ID: Amber Zavala, female    DOB: [birth year], 49 y.o.   MRN: [id number]  HPI Amber Zavala is a 49 year old married female smoker,,,,, one pack of cigarettes a day,,,,, again declines to try to stop smoking,,,,, who comes in today for evaluation of 4 issues in a 15 minute office visit  I asked her to pick which of these issues was the most important. She's due to come back in April for physical examination  She says for the last month she's had itching all over. No skin rash. She's never had any allergies or asthma. Never had a problem like this before.  She also has numbness and tingling in her left hand comes and goes. Again she smokes a pack of cigarettes a day    Review of Systems Review of systems otherwise negative    Objective:   Physical Exam Well-developed well-nourished female no acute distress examination skin shows no evidence of a rash. It's erythematous with some scaling from itching  Examination left hand shows rubor related to chronic nicotine abuse pulses 2+       Assessment & Plan:  Itching unknown etiology...Marland KitchenMarland Kitchen Prednisone burst and taper  Numbness left hand........Marland Kitchen probable carpal tunnel syndrome,,,,,,,, splint return when necessary

## 2014-09-18 NOTE — Progress Notes (Signed)
Pre visit review using our clinic review tool, if applicable. No additional management support is needed unless otherwise documented below in the visit note. 

## 2014-09-18 NOTE — Patient Instructions (Signed)
Prednisone 20 mg,,,,,,, 2 tabs 3 days taper as outlined  Short arm splint,,,,,,,,, [facility] on [address]

## 2014-11-23 ENCOUNTER — Encounter: Payer: Self-pay | Admitting: Family Medicine

## 2014-11-28 ENCOUNTER — Encounter: Payer: Self-pay | Admitting: Cardiovascular Disease

## 2014-11-28 ENCOUNTER — Ambulatory Visit (INDEPENDENT_AMBULATORY_CARE_PROVIDER_SITE_OTHER): Payer: Self-pay | Admitting: Cardiovascular Disease

## 2014-11-28 VITALS — BP 130/74 | HR 57 | Ht 67.0 in | Wt 151.2 lb

## 2014-11-28 DIAGNOSIS — R002 Palpitations: Secondary | ICD-10-CM

## 2014-11-28 DIAGNOSIS — Z720 Tobacco use: Secondary | ICD-10-CM

## 2014-11-28 DIAGNOSIS — G4733 Obstructive sleep apnea (adult) (pediatric): Secondary | ICD-10-CM

## 2014-11-28 MED ORDER — METOPROLOL TARTRATE 25 MG PO TABS: ORAL_TABLET | ORAL | Status: DC

## 2014-11-28 NOTE — Patient Instructions (Addendum)
Your physician recommends that you return for lab work fasting.  Your physician has requested that you have an echocardiogram. Echocardiography is a painless test that uses sound waves to create images of your heart. It provides your doctor with information about the size and shape of your heart and how well your heart's chambers and valves are working. This procedure takes approximately one hour. There are no restrictions for this procedure.  Your physician has recommended that you wear an event monitor. Event monitors are medical devices that record the heart's electrical activity. Doctors most often [place] these monitors to diagnose arrhythmias. Arrhythmias are problems with the speed or rhythm of the heartbeat. The monitor is a small, portable device. You can wear one while you do your normal daily activities. This is usually used to diagnose what is causing palpitations/syncope (passing out). This will be worn for 14 days. >> FOR PATIENT ASSISTANCE go to http://[email or web address]/. Click on the "Patients" tab at the top of the page. On the left hand side select 'Financial Assistance.' Fill out the form and submit application. If approved, they will mail you a monitor.  Your physician recommends that you schedule a follow-up appointment in: after tests.  Your physician has recommended you make the following change in your medication: lopressor 25 mg has been ordered for you to take as needed for palpitations. This has been sent to your pharmacy.

## 2014-11-29 ENCOUNTER — Encounter: Payer: Self-pay | Admitting: Cardiovascular Disease

## 2014-11-29 DIAGNOSIS — R002 Palpitations: Secondary | ICD-10-CM | POA: Insufficient documentation

## 2014-11-29 DIAGNOSIS — Z720 Tobacco use: Secondary | ICD-10-CM | POA: Insufficient documentation

## 2014-11-29 NOTE — Progress Notes (Signed)
Patient ID: Amber Zavala, female   DOB: [birth year], 49 y.o.   MRN: [id number]   Primary M.D.: Dr. Stevie Zavala  PATIENT PROFILE: Amber Zavala is a 49 y.o. female who is self-referred for evaluation of palpitations.   HPI:  Amber Zavala is a 49 y.o. female denies any known coronary artery disease.  She has a 32 year history of tobacco use and currently smoking 1 pack per day.  Over the past month, she has expressed recurrent episodes of intermittent palpitations which he describes that her heart beats hard and fast.  These are not prolonged.  An recurrent short bursts.  However, she has noticed that recently they have been occurring almost daily.  She denies associated chest pressure and denies any associated presyncope or syncope.  She states that she does not sleep well.  Over 10 years ago.  She was told of having moderate sleep apnea but CPAP therapy was never recommended.  Currently she wakes up a proximally 6-10 times per night.  She typically goes to bed at 9:00 and wakes up at 5 AM for work.  Her sleep is nonrestorative.  She has noticed occasional pruritus and has recently completed a course of prednisone.Marland Kitchen  He presents to the office today for cardiology evaluation.  Past Medical History  Diagnosis Date  . Tobacco abuse   . S/P tubal ligation     bilateral   . Sleep apnea     Past Surgical History  Procedure Laterality Date  . Appendectomy    . Rhinoplasty  2007  . Breast enhancement surgery      No Known Allergies  Current Outpatient Prescriptions  Medication Sig Dispense Refill  . metoprolol tartrate (LOPRESSOR) 25 MG tablet Take 1 tablet as needed for palpitations. 30 tablet 3   No current facility-administered medications for this visit.    Socially she is married in her third marriage.  He has one daughter from a previous marriage at age 24.  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  She completed 12th grade of education.  She smokes one pack  of cigarettes per day.  Has been smoking for proximally 32 years.  She drinks approximately 10 drinks of vodka weekly.  She denies any recreational or illicit drug use.  She does not routinely exercise but does regular housework and outside work.  Family History  Problem Relation Age of Onset  . Lupus Mother   . Arthritis Mother   . Lung cancer Father   . Colon cancer Neg Hx   . Esophageal cancer Neg Hx   . Rectal cancer Neg Hx   . Stomach cancer Neg Hx   . Stroke Paternal Grandmother   . Lung cancer Paternal Grandfather    Additional family history is notable in that her mother is age 85 with lupus.  Father died at 82 and had lung cancer and it smoked heavily.  Her paternal grandparents died in their 21s from a stroke and lung cancer.  She has a sister age 32, who is healthy.  ROS General: Negative; No fevers, chills, or night sweats HEENT: Negative; No changes in vision or hearing, sinus congestion, difficulty swallowing Pulmonary: Negative; No cough, wheezing, shortness of breath, hemoptysis Cardiovascular:  See HPI; No chest pain, presyncope, syncope, palpitations, edema GI: Negative; No nausea, vomiting, diarrhea, or abdominal pain GU: Negative; No dysuria, hematuria, or difficulty voiding Musculoskeletal: Negative; no myalgias, joint pain, or weakness Hematologic/Oncologic: Negative; no easy bruising, bleeding Endocrine: Negative; no  heat/cold intolerance; no diabetes Neuro: Negative; no changes in balance, headaches Skin: Positive for tattoos Psychiatric: Negative; No behavioral problems, depression Sleep: Negative; No daytime sleepiness, hypersomnolence, bruxism, restless legs, hypnogagnic hallucinations Other comprehensive 14 point system review is negative   Physical Exam BP 130/74 mmHg  Pulse 57  Ht 5\' 7"  (1.702 m)  Wt 151 lb 3.2 oz (68.584 kg)  BMI 23.68 kg/m2 General: Alert, oriented, no distress.  Skin: Numerous tattoos HEENT: Normocephalic, atraumatic. Pupils  equal round and reactive to light; sclera anicteric; extraocular muscles intact; Fundi normal Nose without nasal septal hypertrophy Mouth/Parynx benign; Mallinpatti scale 3 with very elongated uvula. Neck: No JVD, no carotid bruits; normal carotid upstroke Lungs: clear to ausculatation and percussion; no wheezing or rales Chest wall: without tenderness to palpitation Heart: PMI not displaced, RRR, s1 s2 normal, 1/6 systolic murmur, no diastolic murmur, no rubs, gallops, thrills, or heaves Abdomen: soft, nontender; no hepatosplenomehaly, BS+; abdominal aorta nontender and not dilated by palpation. Back: no CVA tenderness Pulses 2+ Musculoskeletal: full range of motion, normal strength, no joint deformities Extremities: no clubbing cyanosis or edema, Homan's sign negative  Neurologic: grossly nonfocal; Cranial nerves grossly wnl Psychologic: Normal mood and affect   ECG (independently read by me): Sinus bradycardia 57 bpm.  No ectopy.  Normal intervals.  LABS:  BMP 02/22/2008  Glucose 85  BUN 17  Creatinine 0.71  Sodium 141  Potassium 4.1  Chloride 104  CO2 31  Calcium 9.7     No flowsheet data found.   CBC Latest Ref Rng 04/18/2013 06/27/2010 02/22/2008  WBC 4.5 - 10.5 K/uL 5.1 4.1(L) 4.6  Hemoglobin 12.0 - 15.0 g/dL 14.1 13.4 14.0  Hematocrit 36.0 - 46.0 % 41.2 38.9 41.2  Platelets 150.0 - 400.0 K/uL 200.0 169.0 209     BNP No results found for: BNP  ProBNP No results found for: PROBNP   Lipid Panel  No results found for: CHOL, TRIG, HDL, CHOLHDL, VLDL, LDLCALC, LDLDIRECT    RADIOLOGY: No results found.   ASSESSMENT AND PLAN: Ms. Amber Zavala is a 49 year old female who has developed a one-month history of fast hard heartbeats, which occur intermittently and do not seem to be exertionally precipitated.  They can occur at any time.  She denies clear-cut exertional precipitation.  I am recommending that she have a 2-D echo Doppler study to assess for potential  structural heart disease.  I've also recommended a two-week CardioNet monitor.  Presently, she will be trying to apply for financial assistance through [place].  I am concerned that she may very well have moderate sleep apnea, untreated.  She has frequent awakenings and feels tired upon arising in the morning.  I discussed the potential reevaluation for sleep apnea.  However, she would prefer to defer this.  I'm so recommending a complete set of blood work be obtained in the fasting state.  I'm giving her a prescription for metoprolol 25 mg to take on a when necessary basis.  I will see her back in 4-6 weeks for follow up evaluation following the above studies and further recommendations will be made at that time.  I also discussed the importance of smoking cessation.   Troy Sine, MD, [facility] 11/29/2014 8:55 PM

## 2014-11-30 LAB — COMPREHENSIVE METABOLIC PANEL
ALT: 15 U/L (ref 0–35)
AST: 17 U/L (ref 0–37)
Albumin: 4.6 g/dL (ref 3.5–5.2)
Alkaline Phosphatase: 48 U/L (ref 39–117)
BILIRUBIN TOTAL: 0.8 mg/dL (ref 0.2–1.2)
BUN: 13 mg/dL (ref 6–23)
CO2: 30 meq/L (ref 19–32)
Calcium: 9.9 mg/dL (ref 8.4–10.5)
Chloride: 103 mEq/L (ref 96–112)
Creat: 0.83 mg/dL (ref 0.50–1.10)
GLUCOSE: 82 mg/dL (ref 70–99)
Potassium: 4.5 mEq/L (ref 3.5–5.3)
Sodium: 142 mEq/L (ref 135–145)
Total Protein: 7.6 g/dL (ref 6.0–8.3)

## 2014-11-30 LAB — CBC
HEMATOCRIT: 47.8 % — AB (ref 36.0–46.0)
HEMOGLOBIN: 16 g/dL — AB (ref 12.0–15.0)
MCH: 31.6 pg (ref 26.0–34.0)
MCHC: 33.5 g/dL (ref 30.0–36.0)
MCV: 94.3 fL (ref 78.0–100.0)
MPV: 10.4 fL (ref 8.6–12.4)
Platelets: 220 10*3/uL (ref 150–400)
RBC: 5.07 MIL/uL (ref 3.87–5.11)
RDW: 13.2 % (ref 11.5–15.5)
WBC: 3.8 10*3/uL — AB (ref 4.0–10.5)

## 2014-11-30 LAB — LIPID PANEL
CHOL/HDL RATIO: 2.4 ratio
CHOLESTEROL: 172 mg/dL (ref 0–200)
HDL: 71 mg/dL (ref >=46)
LDL Cholesterol: 89 mg/dL (ref 0–99)
TRIGLYCERIDES: 60 mg/dL (ref <150)
VLDL: 12 mg/dL (ref 0–40)

## 2014-12-01 LAB — TSH: TSH: 2.5 u[IU]/mL (ref 0.350–4.500)

## 2014-12-04 ENCOUNTER — Ambulatory Visit (HOSPITAL_COMMUNITY)
Admission: RE | Admit: 2014-12-04 | Discharge: 2014-12-04 | Disposition: A | Discharge status: Home / Self Care | Payer: Self-pay | Source: Ambulatory Visit | Attending: Cardiology | Admitting: Cardiology

## 2014-12-04 DIAGNOSIS — R002 Palpitations: Secondary | ICD-10-CM

## 2014-12-04 DIAGNOSIS — G4733 Obstructive sleep apnea (adult) (pediatric): Secondary | ICD-10-CM | POA: Insufficient documentation

## 2014-12-04 DIAGNOSIS — R0600 Dyspnea, unspecified: Secondary | ICD-10-CM | POA: Insufficient documentation

## 2014-12-04 DIAGNOSIS — Z720 Tobacco use: Secondary | ICD-10-CM | POA: Insufficient documentation

## 2014-12-07 ENCOUNTER — Other Ambulatory Visit (HOSPITAL_COMMUNITY): Payer: Self-pay

## 2014-12-07 ENCOUNTER — Encounter: Payer: Self-pay | Admitting: Cardiovascular Disease

## 2014-12-07 ENCOUNTER — Ambulatory Visit (INDEPENDENT_AMBULATORY_CARE_PROVIDER_SITE_OTHER): Payer: Self-pay | Admitting: Cardiovascular Disease

## 2014-12-07 VITALS — BP 106/80 | HR 64 | Ht 68.0 in | Wt 149.0 lb

## 2014-12-07 DIAGNOSIS — G4733 Obstructive sleep apnea (adult) (pediatric): Secondary | ICD-10-CM

## 2014-12-07 DIAGNOSIS — R002 Palpitations: Secondary | ICD-10-CM

## 2014-12-07 DIAGNOSIS — Z720 Tobacco use: Secondary | ICD-10-CM

## 2014-12-07 NOTE — Progress Notes (Signed)
Patient ID: Amber Zavala, female   DOB: May 22, 1966, 49 y.o.   MRN: [id number]   Primary M.D.: Dr. Stevie Kern   HPI:  Amber Zavala is a 49 y.o. female who I recently saw for initial cardiology evaluation of episodic palpitations.  She denies any known coronary artery disease.  She has a 32 year history of tobacco use and currently smoking 1 pack per day.  Over the past month, she has experienced recurrent episodes of intermittent palpitations which he describes that her heart beats hard and fast.  These are not prolonged.  These were beginning to occur almost daily, which prompted her initial cardiology evaluation over a week ago.  She underwent an echo Doppler study which was done on 12/04/2014.  This showed normal systolic and diastolic function with an ejection fraction of 55-60%.  There was no significant valvular pathology.  We had discussed that she undergo a CardioNet monitor.  However, due to initial constraints, she elected not to have this done.  She was given a prescription for metoprolol, tartrate to take 25 mg on an as-needed basis.  She has not yet filled this prescription.   She states that she does not sleep well.  She has a history of moderate obstructive sleep apnea and had undergone a sleep study proximally 9 years ago, which revealed an AHI of 25.  She was never started on CPAP therapy.   Currently she wakes up a ~ 6-10 times per night.  She typically goes to bed at 9:00 and wakes up at 5 AM for work.  Her sleep is nonrestorative.  She had noticed occasional pruritus and has recently completed a course of prednisone..  She presents for follow-up evaluation.  Past Medical History  Diagnosis Date  . Tobacco abuse   . S/P tubal ligation     bilateral   . Sleep apnea     Past Surgical History  Procedure Laterality Date  . Appendectomy    . Rhinoplasty  2007  . Breast enhancement surgery      No Known Allergies  Current Outpatient Prescriptions  Medication Sig  Dispense Refill  . metoprolol tartrate (LOPRESSOR) 25 MG tablet Take 1 tablet as needed for palpitations. (Patient not taking: Reported on 12/07/2014) 30 tablet 3   No current facility-administered medications for this visit.    Socially she is married in her third marriage.  He has one daughter from a previous marriage at age 19.  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  She completed 12th grade of education.  She smokes one pack of cigarettes per day.  Has been smoking for proximally 32 years.  She drinks approximately 10 drinks of vodka weekly.  She denies any recreational or illicit drug use.  She does not routinely exercise but does regular housework and outside work.  Family History  Problem Relation Age of Onset  . Lupus Mother   . Arthritis Mother   . Lung cancer Father   . Colon cancer Neg Hx   . Esophageal cancer Neg Hx   . Rectal cancer Neg Hx   . Stomach cancer Neg Hx   . Stroke Paternal Grandmother   . Lung cancer Paternal Grandfather    Additional family history is notable in that her mother is age 20 with lupus.  Father died at 35 and had lung cancer and it smoked heavily.  Her paternal grandparents died in their 3s from a stroke and lung cancer.  She has a sister  age 73, who is healthy.  ROS General: Negative; No fevers, chills, or night sweats HEENT: Negative; No changes in vision or hearing, sinus congestion, difficulty swallowing Pulmonary: Negative; No cough, wheezing, shortness of breath, hemoptysis Cardiovascular:  See HPI; No chest pain, presyncope, syncope, palpitations, edema GI: Negative; No nausea, vomiting, diarrhea, or abdominal pain GU: Negative; No dysuria, hematuria, or difficulty voiding Musculoskeletal: Negative; no myalgias, joint pain, or weakness Hematologic/Oncologic: Negative; no easy bruising, bleeding Endocrine: Negative; no heat/cold intolerance; no diabetes Neuro: Negative; no changes in balance, headaches Skin: Positive for  tattoos Psychiatric: Negative; No behavioral problems, depression Sleep: Negative; No daytime sleepiness, hypersomnolence, bruxism, restless legs, hypnogagnic hallucinations Other comprehensive 14 point system review is negative   Physical Exam BP 106/80 mmHg  Pulse 64  Ht '5\' 8"'  (1.727 m)  Wt 149 lb (67.586 kg)  BMI 22.66 kg/m2   Wt Readings from Last 3 Encounters:  12/07/14 149 lb (67.586 kg)  11/28/14 151 lb 3.2 oz (68.584 kg)  09/18/14 169 lb (76.658 kg)   General: Alert, oriented, no distress.  Skin: Numerous tattoos HEENT: Normocephalic, atraumatic. Pupils equal round and reactive to light; sclera anicteric; extraocular muscles intact; Fundi normal Nose without nasal septal hypertrophy Mouth/Parynx benign; Mallinpatti scale 3 with very elongated uvula. Neck: No JVD, no carotid bruits; normal carotid upstroke Lungs: clear to ausculatation and percussion; no wheezing or rales Chest wall: without tenderness to palpitation Heart: PMI not displaced, RRR, s1 s2 normal, 1/6 systolic murmur, no diastolic murmur, no rubs, gallops, thrills, or heaves Abdomen: soft, nontender; no hepatosplenomehaly, BS+; abdominal aorta nontender and not dilated by palpation. Back: no CVA tenderness Pulses 2+ Musculoskeletal: full range of motion, normal strength, no joint deformities Extremities: no clubbing cyanosis or edema, Homan's sign negative  Neurologic: grossly nonfocal; Cranial nerves grossly wnl Psychologic: Normal mood and affect  ECG (independently read by me): Sinus rhythm at 64 bpm.  Normal intervals.  No ectopy.  Prior ECG (independently read by me): Sinus bradycardia 57 bpm.  No ectopy.  Normal intervals.  LABS:  BMP Latest Ref Rng 11/28/2014 02/22/2008  Glucose 70 - 99 mg/dL 82 85  BUN 6 - 23 mg/dL 13 17  Creatinine 0.50 - 1.10 mg/dL 0.83 0.71  Sodium 135 - 145 mEq/L 142 141  Potassium 3.5 - 5.3 mEq/L 4.5 4.1  Chloride 96 - 112 mEq/L 103 104  CO2 19 - 32 mEq/L 30 31  Calcium  8.4 - 10.5 mg/dL 9.9 9.7     Hepatic Function Latest Ref Rng 11/28/2014  Total Protein 6.0 - 8.3 g/dL 7.6  Albumin 3.5 - 5.2 g/dL 4.6  AST 0 - 37 U/L 17  ALT 0 - 35 U/L 15  Alk Phosphatase 39 - 117 U/L 48  Total Bilirubin 0.2 - 1.2 mg/dL 0.8     CBC Latest Ref Rng 11/28/2014 04/18/2013 06/27/2010  WBC 4.0 - 10.5 K/uL 3.8(L) 5.1 4.1(L)  Hemoglobin 12.0 - 15.0 g/dL 16.0(H) 14.1 13.4  Hematocrit 36.0 - 46.0 % 47.8(H) 41.2 38.9  Platelets 150 - 400 K/uL 220 200.0 169.0     BNP No results found for: BNP  ProBNP No results found for: PROBNP   Lipid Panel     Component Value Date/Time   CHOL 172 11/28/2014 0756   TRIG 60 11/28/2014 0756   HDL 71 11/28/2014 0756   CHOLHDL 2.4 11/28/2014 0756   VLDL 12 11/28/2014 0756   LDLCALC 89 11/28/2014 0756      RADIOLOGY: No results found.  ASSESSMENT AND PLAN: Ms. Tarina Volk is a 49 year old female who had developed a one-month history of fast hard heartbeats, which occur intermittently and do not seem to be exertionally precipitated.  They can occur at any time.  She denies clear-cut exertional precipitation.  Her echo Doppler study does not demonstrate any structural heart disease and is essentially normal.  She decided presently against having the [place] monitor worn.  Her ECG shows sinus rhythm.  He does not sound she is having episodes of atrial fibrillation or SVT and may be just having transient ectopic beats.  She has a prescription for metoprolol tartrate, but has not yet filled this.  I have reassured her that I feel she is stable.  Cardiovascular.  I again discussed importance of smoking cessation.  I also discussed if she is having more nocturnal palpitations that sleep apnea may be playing a significant role.  When I last saw her, she was against having a follow-up sleep study at this time.  If she continues to experience recurrent arrhythmias she will undergo a CardioNet monitor and I will see her back.  Otherwise, I will  be available on an as-needed basis if problems arise.  Troy Sine, MD, [facility] 12/07/2014 6:52 PM

## 2014-12-07 NOTE — Patient Instructions (Signed)
Your physician recommends that you schedule a follow-up appointment as needed with Dr. Claiborne Billings if symptoms return call back to have event monitor placed.

## 2015-01-25 ENCOUNTER — Telehealth: Payer: Self-pay | Admitting: Family Medicine

## 2015-01-25 NOTE — Telephone Encounter (Signed)
Pt states dr todd prescribed her something for hot flashes, (but is it normally used for people w/ high blood pressure,) and she cannot find the med or knows what it is.  I was unable to fined it either. Pt states she has been having hot flashes,worst in the night, but basically all the time. Can you help? [facility]

## 2015-01-25 NOTE — Telephone Encounter (Signed)
Waiting on a response from Dr Sherren Mocha

## 2015-02-01 MED ORDER — CLONIDINE HCL 0.1 MG PO TABS: 0.1000 mg | ORAL_TABLET | Freq: Every day | ORAL | Status: DC

## 2015-02-01 NOTE — Telephone Encounter (Signed)
Clonidine per Dr Sherren Mocha. Patient should try for 30 days and if no improvement patient should go to GYN.  Left message on machine for patient.  Rx sent.

## 2015-09-26 ENCOUNTER — Ambulatory Visit (INDEPENDENT_AMBULATORY_CARE_PROVIDER_SITE_OTHER): Payer: Self-pay | Admitting: Podiatry

## 2015-09-26 ENCOUNTER — Encounter: Payer: Self-pay | Admitting: Podiatry

## 2015-09-26 VITALS — BP 123/86 | HR 69 | Resp 12

## 2015-09-26 DIAGNOSIS — L603 Nail dystrophy: Secondary | ICD-10-CM

## 2015-09-26 DIAGNOSIS — L600 Ingrowing nail: Secondary | ICD-10-CM

## 2015-09-26 NOTE — Progress Notes (Signed)
   Subjective:    Patient ID: Amber Zavala, female    DOB: 04-14-1966, 50 y.o.   MRN: [id number]  HPI    This patient presents today complaining of a three-month history of discomfort along the lateral margin of the left hallux toenail. The margin is tender with direct pressure and shoe wearing. The lateral margin was removed for permanent correction on the visit of 07/31/2014 by myself. Patient is also concerned about the general appearance of the nail plate and has interest in active treatment suspect mycotic infection    Review of Systems  Skin: Positive for color change.       Objective:   Physical Exam  Orientated 3  Vascular: No peripheral edema noted bilaterally DP and PT pulses 2/4 bilaterally Capillary reflex immediate bilaterally  Neurological: Deferred  Dermatological: Left hallux nail plate has dystrophic changes with recurrence and incurvation of the lateral margin of the left hallux toenail. Lateral margin left hallux toenail is tender to direct palpation. There is no surrounding erythema, edema, drainage noted  Musculoskeletal: Living second third toes bilaterally      Assessment & Plan:   Assessment: Recurrence of ingrowing lateral margin left hallux toenail Mycotic left hallux toenail  Plan: Today you the results of examination with patient today and made aware that the nail margin had recurred. I told her that I could excise the lateral margin and repeat a phenol matricectomy, howeverl sure that the nail margin would not regenerate. Patient understands that repeat phenol matricectomy has the risk of recurrence and she verbally consents. Also patient would like to have active treatment for suspect mycotic nail infection. Patient states that she would consider the use of oral medication in treatment of the suspect mycotic toenail infection.  The left hallux was blocked with 3 mL 50-50 mixture of 2% plain Xylocaine and 0.5% plain Marcaine. Left hallux  was painted with Betadine and exsanguinated. The lateral margin of the left hallux nail was excised and a phenol matricectomy performed. The nail margin was flushed with alcohol and an antibiotic compression dressing was applied. The tourniquet was released and spontaneous capillary fill time noted in the left hallux. He should tolerated procedure without any difficulty. Postoperative oral reconstruction provided.  The margin left hallux toenail was submitted for a PAS stain and fungal culture and will notify patient upon results of lab results.  Call patient upon receipt of lab results

## 2015-09-26 NOTE — Patient Instructions (Signed)
ANTIBACTERIAL SOAP INSTRUCTIONS  THE DAY AFTER PROCEDURE  Please follow the instructions your doctor has marked.   Shower as usual. Before getting out, place a drop of antibacterial liquid soap (Dial) on a wet, clean washcloth.  Gently wipe washcloth over affected area.  Afterward, rinse the area with warm water.  Blot the area dry with a soft cloth and cover with antibiotic ointment (neosporin, polysporin, bacitracin) and band aid or gauze and tape  Place 3-4 drops of antibacterial liquid soap in a quart of warm tap water.  Submerge foot into water for 2 minutes.  If bandage was applied after your procedure, leave on to allow for easy lift off, then remove and continue with soak for the remaining time.  Next, blot area dry with a soft cloth and cover with a bandage.  Apply other medications as directed by your doctor, such as cortisporin otic solution (eardrops) or neosporin antibiotic ointment 

## 2015-10-23 ENCOUNTER — Telehealth: Payer: Self-pay | Admitting: *Deleted

## 2015-10-23 NOTE — Telephone Encounter (Signed)
Dr. Amalia Hailey reviewed 09/26/2015 fungal culture results as negative, no treatment needed, pt may consider removal.  Results called to pt.

## 2015-11-02 ENCOUNTER — Encounter: Payer: Self-pay | Admitting: Podiatry

## 2015-11-20 ENCOUNTER — Ambulatory Visit (INDEPENDENT_AMBULATORY_CARE_PROVIDER_SITE_OTHER): Payer: Self-pay | Admitting: Pulmonary Disease

## 2015-11-20 ENCOUNTER — Encounter: Payer: Self-pay | Admitting: Pulmonary Disease

## 2015-11-20 ENCOUNTER — Ambulatory Visit (INDEPENDENT_AMBULATORY_CARE_PROVIDER_SITE_OTHER)
Admission: RE | Admit: 2015-11-20 | Discharge: 2015-11-20 | Disposition: A | Discharge status: Home / Self Care | Payer: Self-pay | Source: Ambulatory Visit | Attending: Pulmonary Disease | Admitting: Pulmonary Disease

## 2015-11-20 VITALS — BP 124/84 | HR 60 | Ht 68.5 in | Wt 154.6 lb

## 2015-11-20 DIAGNOSIS — R0602 Shortness of breath: Secondary | ICD-10-CM

## 2015-11-20 DIAGNOSIS — J449 Chronic obstructive pulmonary disease, unspecified: Secondary | ICD-10-CM

## 2015-11-20 DIAGNOSIS — Z720 Tobacco use: Secondary | ICD-10-CM

## 2015-11-20 DIAGNOSIS — R002 Palpitations: Secondary | ICD-10-CM

## 2015-11-20 MED ORDER — TIOTROPIUM BROMIDE-OLODATEROL 2.5-2.5 MCG/ACT IN AERS: 2.0000 | INHALATION_SPRAY | Freq: Every day | RESPIRATORY_TRACT | Status: DC

## 2015-11-20 MED ORDER — ALBUTEROL SULFATE 108 (90 BASE) MCG/ACT IN AEPB: 2.0000 | INHALATION_SPRAY | RESPIRATORY_TRACT | Status: DC | PRN

## 2015-11-20 NOTE — Progress Notes (Signed)
Subjective:    Patient ID: Amber Zavala, female    DOB: 05/14/1966, 50 y.o.   MRN: [id number]  HPI   Chief Complaint  Patient presents with  . Pulmonary Consult    Self Referral.  Smoker 30+ years.  SOB, DOE.     50 year old smoker presents for evaluation of dyspnea. She was seen in 2013 for evaluation of OSA  She reports increasing dyspnea over the last 3-4 months on routine activities such as making her bed or climbing stairs. She is able to walk on level ground and shop in the store and walk with  grocery bags. Stopping the activity and resting makes her feel better. She denies wheezing or frequent chest colds or weight loss She reports an early morning throat clearing. She smokes about a pack per day for 30 years-about 30-pack-years She works as a Network engineer. She is married and lives with husband She reports family history of lung cancer in her dad and grandfather  She denies chest pain, orthopnea paroxysmal nocturnal dyspnea or pedal edema  Spirometry shows FEV1 63% with ratio 51 and FVC 100%, some flattening of the expiratory loop PSG 2007 AHI 25/h   Past Medical History  Diagnosis Date  . Tobacco abuse   . S/P tubal ligation     bilateral   . Sleep apnea     Past Surgical History  Procedure Laterality Date  . Appendectomy    . Rhinoplasty  2007  . Breast enhancement surgery    . Bilateral temporomandibular joint arthroplasty      No Known Allergies  Social History   Social History  . Marital Status: Married    Spouse Name: N/A  . Number of Children: N/A  . Years of Education: N/A   Occupational History  . Occupational hygienist    Social History Main Topics  . Smoking status: Current Every Day Smoker -- 1.00 packs/day for 30 years    Types: Cigarettes  . Smokeless tobacco: Never Used  . Alcohol Use: 0.0 oz/week    0 Standard drinks or equivalent per week     Comment: 4 times a week  . Drug Use: No  . Sexual Activity: Not on file   Other Topics  Concern  . Not on file   Social History Narrative    Family History  Problem Relation Age of Onset  . Lupus Mother   . Arthritis Mother   . Lung cancer Father   . Colon cancer Neg Hx   . Esophageal cancer Neg Hx   . Rectal cancer Neg Hx   . Stomach cancer Neg Hx   . Stroke Paternal Grandmother   . Lung cancer Paternal Grandfather        Review of Systems Constitutional: negative for anorexia, fevers and sweats  Eyes: negative for irritation, redness and visual disturbance  Ears, nose, mouth, throat, and face: negative for earaches, epistaxis, nasal congestion and sore throat  Respiratory: negative for cough, dyspnea on exertion, sputum and wheezing  Cardiovascular: negative for chest pain, dyspnea, lower extremity edema, orthopnea, palpitations and syncope  Gastrointestinal: negative for abdominal pain, constipation, diarrhea, melena, nausea and vomiting  Genitourinary:negative for dysuria, frequency and hematuria  Hematologic/lymphatic: negative for bleeding, easy bruising and lymphadenopathy  Musculoskeletal:negative for arthralgias, muscle weakness and stiff joints  Neurological: negative for coordination problems, gait problems, headaches and weakness  Endocrine: negative for  polydipsia, polyuria and weight loss     Objective:   Physical Exam  Gen. Pleasant, well-nourished,  in no distress ENT - no lesions, no post nasal drip Neck: No JVD, no thyromegaly, no carotid bruits Lungs: no use of accessory muscles, no dullness to percussion, clear without rales or rhonchi  Cardiovascular: Rhythm regular, heart sounds  normal, no murmurs or gallops, no peripheral edema Musculoskeletal: No deformities, no cyanosis or clubbing        Assessment & Plan:

## 2015-11-20 NOTE — Assessment & Plan Note (Signed)
CXR today You do have moderate COPD Lung function is decreased at 63% You have to stop smoking  Trial of albuterol 2 puffs every 6 hours as needed Once she gets insurance we can try LABA/LAMA combination

## 2015-11-20 NOTE — Patient Instructions (Signed)
CXR today You do have moderate COPD Lung function is decreased at 63% You have to stop smoking  Trial of albuterol 2 puffs every 6 hours as needed

## 2015-11-20 NOTE — Assessment & Plan Note (Signed)
I emphasized to her that smoking cessation remains the primary intervention We discussed options including patches, gum an electronic cigarettes She would like to avoid Chantix

## 2015-11-21 ENCOUNTER — Telehealth: Payer: Self-pay | Admitting: Pulmonary Disease

## 2015-11-21 NOTE — Telephone Encounter (Signed)
Pt is aware of results. While speaking to her she inquired about being started on a maintenance inhaler. States that at her appointment RA did not mention if she needed one. Feels like she would benefit from this. Pt does not have prescription drug coverage and would need to apply for patient assistance if an inhaler is started.  RA - please advise if pt would benefit from a daily inhaler. Thanks.

## 2015-11-21 NOTE — Telephone Encounter (Signed)
Left message for patient to call back  

## 2015-11-21 NOTE — Telephone Encounter (Signed)
Since she does not have prescription benefits-did not start on maintenance inhaler Give her sample of albuterol to use as needed Can certainly start [place] if she is still symptomatic in spite of using her albuterol

## 2015-11-22 NOTE — Telephone Encounter (Signed)
LMTC x 1  

## 2015-11-22 NOTE — Telephone Encounter (Signed)
[phone] calling back

## 2015-11-22 NOTE — Telephone Encounter (Signed)
Pt aware to use her Albuterol Respiclick as directed no more than every 4 hours for symptoms. Pt aware that if needing Albuterol more frequent and still having symptoms despite using the Albuterol then she will need to start [place]. Pt wishes to wait on [place] calling in a prescription of Stiolto - pt states that she will call if she feels she needs this medication.  Nothing further needed.

## 2015-12-06 ENCOUNTER — Telehealth: Payer: Self-pay | Admitting: Family Medicine

## 2015-12-06 DIAGNOSIS — G2581 Restless legs syndrome: Secondary | ICD-10-CM

## 2015-12-06 NOTE — Telephone Encounter (Signed)
Pt request refill  rOPINIRole (REQUIP) 0.5 MG tablet  [facility]

## 2015-12-07 NOTE — Telephone Encounter (Signed)
Patient needs an appointment for more refills.

## 2015-12-07 NOTE — Telephone Encounter (Signed)
Office visit for more refills

## 2015-12-07 NOTE — Telephone Encounter (Signed)
Pt states she does not have insurance and does not understand why we just can refill. Advised pt she had not been seen since 08/2014.

## 2016-02-19 ENCOUNTER — Ambulatory Visit: Payer: Self-pay | Admitting: Pulmonary Disease

## 2016-09-17 IMAGING — DX DG CHEST 2V
2 series · 2 of 2 positions shown · non-contrast
Comparison: PA and lateral chest x-ray July 07, 2008

CLINICAL DATA: Dyspnea on exertion for the past 5 months, history
of COPD, current smoker.

EXAM:
CHEST  2 VIEW

[chest pa]
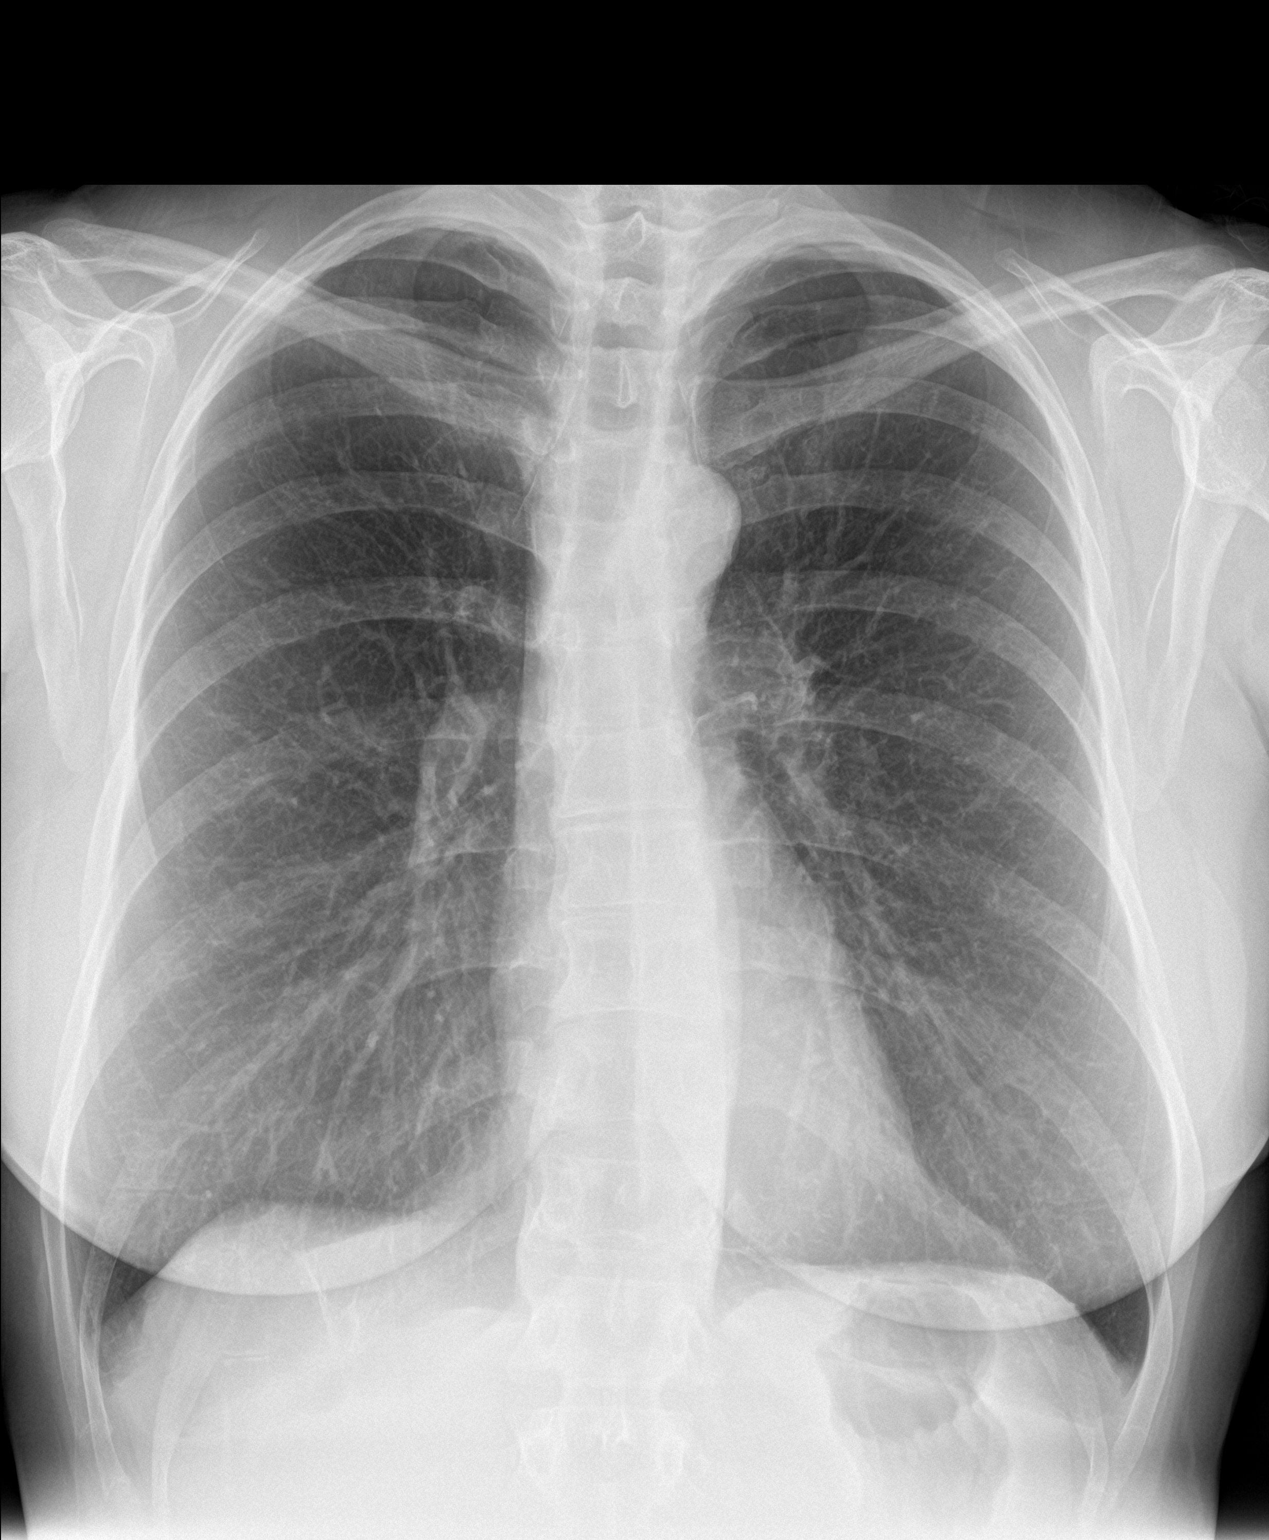

[chest lat]
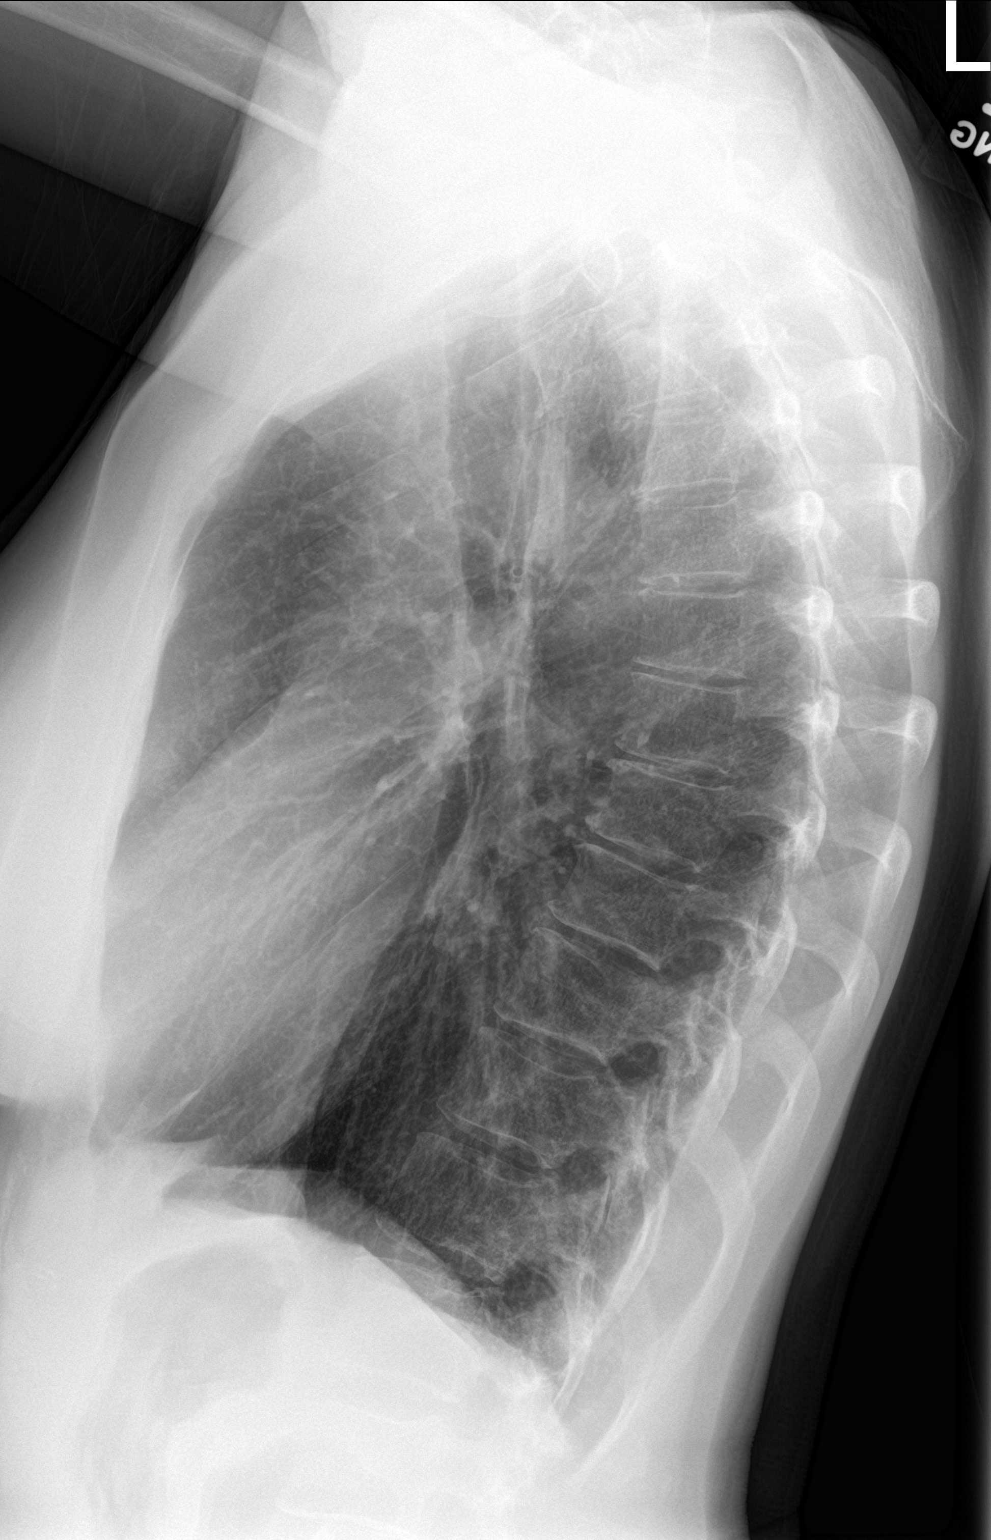

[2 of 2 positions shown; findings below may reference images not displayed]

FINDINGS: The lungs remain hyperinflated with hemidiaphragm flattening. There
is no focal infiltrate. There is no pleural effusion. The heart and
pulmonary vascularity are normal. The mediastinum is normal in
width. The bony thorax is unremarkable.
IMPRESSION: COPD. There is no evidence of pneumonia, CHF, nor other acute
cardiopulmonary abnormality.

## 2016-09-19 ENCOUNTER — Ambulatory Visit: Payer: Self-pay | Admitting: Podiatry

## 2016-09-24 ENCOUNTER — Ambulatory Visit (INDEPENDENT_AMBULATORY_CARE_PROVIDER_SITE_OTHER): Payer: Self-pay | Admitting: Podiatry

## 2016-09-24 ENCOUNTER — Encounter: Payer: Self-pay | Admitting: Podiatry

## 2016-09-24 DIAGNOSIS — L600 Ingrowing nail: Secondary | ICD-10-CM

## 2016-09-24 NOTE — Patient Instructions (Signed)

## 2016-09-24 NOTE — Progress Notes (Signed)
Subjective:     Patient ID: Amber Zavala, female   DOB: 1965-10-31, 51 y.o.   MRN: [id number]  HPI patient states the left big toenail is very tender and she cannot cut it. 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 Explained procedure and risk and today I infiltrated the left hallux 60 mg I can Marcaine mixture removed the big toe nail exposed matrix and applied phenol 5 applications 30 seconds followed by alcohol lavaged sterile dressing. Gave instructions on soaks and reappoint

## 2016-09-25 ENCOUNTER — Telehealth: Payer: Self-pay | Admitting: *Deleted

## 2016-09-25 MED ORDER — TRAMADOL HCL 50 MG PO TABS: 50.0000 mg | ORAL_TABLET | Freq: Three times a day (TID) | ORAL | 0 refills | Status: DC | PRN

## 2016-09-25 NOTE — Telephone Encounter (Addendum)
Pt states she had her left great toenail removed yesterday, needs pain medication. I spoke with pt, asked if she had begun the soaks and she states she began the epsom salt soaks in tepid water and it burned like fire. I told pt that she could switch to 1 C white vinegar to 1 qt warm water or 1 tablespoon of betadine to 1 qt of warm water and soak 20 minutes twice a day for 4-6 weeks. I asked pt if she could take ibuprofen and she said she took tons of that yesterday and it did not help. I told pt I would ask Dr. Paulla Dolly for a prescription. Dr. Paulla Dolly ordered Vicodin 5/325mg  #15 one tablet every 8 hours prn foot pain if pt can come to the office to pick up, if not Tramadol 50mg  #15 one tablet every 8 hours prn foot pain. I informed pt and she states her husband is so busy she doesn't think he will be able to get to the pharmacy, order the Tramadol. I told pt she could take the OTC ibuprofen as the package instructs, if she is able to tolerate. Pt states understanding. [id number].

## 2016-10-16 ENCOUNTER — Ambulatory Visit: Payer: Self-pay | Admitting: Podiatrist

## 2017-09-25 ENCOUNTER — Encounter: Payer: Self-pay | Admitting: Pulmonary Disease

## 2017-09-25 ENCOUNTER — Ambulatory Visit (INDEPENDENT_AMBULATORY_CARE_PROVIDER_SITE_OTHER): Payer: Self-pay | Admitting: Pulmonary Disease

## 2017-09-25 VITALS — BP 126/80 | HR 72 | Ht 68.0 in | Wt 161.0 lb

## 2017-09-25 DIAGNOSIS — Z720 Tobacco use: Secondary | ICD-10-CM

## 2017-09-25 DIAGNOSIS — J449 Chronic obstructive pulmonary disease, unspecified: Secondary | ICD-10-CM

## 2017-09-25 MED ORDER — UMECLIDINIUM-VILANTEROL 62.5-25 MCG/INH IN AEPB: 1.0000 | INHALATION_SPRAY | Freq: Every day | RESPIRATORY_TRACT | 0 refills | Status: DC

## 2017-09-25 NOTE — Assessment & Plan Note (Signed)
Surprisingly her lung function appears improved although she continues to smoke. We will trial ANORO-sample was provided, side effects were discussed. She will call us back for prescription if this works

## 2017-09-25 NOTE — Progress Notes (Signed)
   Subjective:    Patient ID: Amber Zavala, female    DOB: 10-07-1965, 52 y.o.   MRN: [id number]  HPI  52 year old smoker presents for FU of COPD She was seen in 2013 for evaluation of OSA She smokes about a pack per day for 32 years-> 30-pack-years  She was seen in 2017, lung function was low and she was given albuterol.  She does not feel that this has helped her much.  She feels that lung function is worse in the last 2 years, she is not short of breath while making her bed and another routine activities.  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 and has done some research regarding COPD and disability  She does report a smoker's cough and occasional wheezing  Family history of lung cancer in her dad and grandfather  Chest x-ray from 2017 was reviewed which shows hyperinflation  Significant tests/ events reviewed  09/2017 spirometry shows ratio 73, FEV1 of 73% and FVC of 79%, surprisingly better  10/2015 Spirometry  FEV1 63% with ratio 51 and FVC 100%, some flattening of the expiratory loop PSG 2007 AHI 25/h  Review of Systems neg for any significant sore throat, dysphagia, itching, sneezing, nasal congestion or excess/ purulent secretions, fever, chills, sweats, unintended wt loss, pleuritic or exertional cp, hempoptysis, orthopnea pnd or change in chronic leg swelling. Also denies presyncope, palpitations, heartburn, abdominal pain, nausea, vomiting, diarrhea or change in bowel or urinary habits, dysuria,hematuria, rash, arthralgias, visual complaints, headache, numbness weakness or ataxia.     Objective:   Physical Exam   Gen. Pleasant, well-nourished, in no distress ENT - no thrush, no post nasal drip Neck: No JVD, no thyromegaly, no carotid bruits Lungs: no use of accessory muscles, no dullness to percussion, clear without rales or rhonchi  Cardiovascular: Rhythm regular, heart sounds  normal, no murmurs or gallops, no  peripheral edema Musculoskeletal: No deformities, no cyanosis or clubbing         Assessment & Plan:

## 2017-09-25 NOTE — Patient Instructions (Addendum)
Trial of ANORO once daily - call for Rx if this helps Lung function is stable

## 2017-09-25 NOTE — Addendum Note (Signed)
Addended by: Elton Sin on: 09/25/2017 12:41 PM   Modules accepted: Orders

## 2017-09-25 NOTE — Assessment & Plan Note (Signed)
Smoking cessation was discussed is the most important intervention here she was not ready to commit to a quit attempt

## 2017-12-28 ENCOUNTER — Ambulatory Visit: Payer: Self-pay | Admitting: Pulmonary Disease

## 2017-12-28 NOTE — Progress Notes (Deleted)
'@Patient'  ID: Amber Zavala, female    DOB: [birth year], 52 y.o.   MRN: [id number]  No chief complaint on file.   Referring provider: Dorena Cookey, MD  HPI:    Recent [name] Encounters:    09/25/17-[place] 52 year old smoker presents for FU of COPD She was seen in 2013 for evaluation of OSA She smokes about a pack per day for 32 years-> 30-pack-years She was seen in 2017, lung function was low and she was given albuterol.  She does not feel that this has helped her much.  She feels that lung function is worse in the last 2 years, she is not short of breath while making her bed and another routine activities.  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 and has done some research regarding COPD and disability She does report a smoker's cough and occasional wheezing Family history of lung cancer in her dad and grandfather Plan: Smoking cessation, patient is not ready to quit, will trial Anoro   Tests:  09/2017 spirometry shows ratio 73, FEV1 of 73% and FVC of 79%, surprisingly better 10/2015 Spirometry  FEV1 63% with ratio 51 and FVC 100%, some flattening of the expiratory loop PSG 2007 AHI 25/h  Imaging:  11/20/2015-chest x-ray-COPD no evidence of pneumonia or CHF, no other acute cardiopulmonary abnormalities  Cardiac:   12/04/2014-echocardiogram-LV ejection fraction 55 to 40%, systolic function was normal  Labs:   Micro:   Chart Review:     12/28/17 OV  No Known Allergies  Immunization History  Administered Date(s) Administered  . Td 09/18/2009    Past Medical History:  Diagnosis Date  . S/P tubal ligation    bilateral   . Sleep apnea   . Tobacco abuse     Tobacco History: Social History   Tobacco Use  Smoking Status Current Every Day Smoker  . Packs/day: 1.00  . Years: 30.00  . Pack years: 30.00  . Types: Cigarettes  Smokeless Tobacco Never Used   Ready to quit: Not Answered Counseling given:  Not Answered   Outpatient Encounter Medications as of 12/28/2017  Medication Sig  . Albuterol Sulfate (PROAIR RESPICLICK) 981 (90 Base) MCG/ACT AEPB Inhale 2 puffs into the lungs every 4 (four) hours as needed. (Patient not taking: Reported on 09/25/2017)  . umeclidinium-vilanterol (ANORO ELLIPTA) 62.5-25 MCG/INH AEPB Inhale 1 puff into the lungs daily.   No facility-administered encounter medications on file as of 12/28/2017.      Review of Systems  Constitutional:   No  weight loss, night sweats,  fevers, chills, fatigue, or  lassitude HEENT:   No headaches,  Difficulty swallowing,  Tooth/dental problems, or  Sore throat, No sneezing, itching, ear ache, nasal congestion, post nasal drip  CV: No chest pain,  orthopnea, PND, swelling in lower extremities, anasarca, dizziness, palpitations, syncope  GI: No heartburn, indigestion, abdominal pain, nausea, vomiting, diarrhea, change in bowel habits, loss of appetite, bloody stools Resp: No shortness of breath with exertion or at rest.  No excess mucus, no productive cough,  No non-productive cough,  No coughing up of blood.  No change in color of mucus.  No wheezing.  No chest wall deformity Skin: no rash, lesions, no skin changes. GU: no dysuria, change in color of urine, no urgency or frequency.  No flank pain, no hematuria  MS:  No joint pain or swelling.  No decreased range of motion.  No back pain. Psych:  No change  in mood or affect. No depression or anxiety.  No memory loss.   Physical Exam  There were no vitals taken for this visit.  GEN: A/Ox3; pleasant , NAD, well nourished    HEENT:  [place]/AT,  EACs-clear, TMs-wnl, NOSE-clear, THROAT-clear, no lesions, no postnasal drip or exudate noted.   NECK:  Supple w/ fair ROM; no JVD; normal carotid impulses w/o bruits; no thyromegaly or nodules palpated; no lymphadenopathy.    RESP:  Clear  P & A; w/o, wheezes/ rales/ or rhonchi. no accessory muscle use, no dullness to percussion  CARD:   RRR, no m/r/g, no peripheral edema, pulses intact, no cyanosis or clubbing.  GI:   Soft & nt; nml bowel sounds; no organomegaly or masses detected.   Musco: Warm bil, no deformities or joint swelling noted.   Neuro: alert, no focal deficits noted.    Skin: Warm, no lesions or rashes    Lab Results:  CBC    Component Value Date/Time   WBC 3.8 (L) 11/28/2014 0756   RBC 5.07 11/28/2014 0756   HGB 16.0 (H) 11/28/2014 0756   HCT 47.8 (H) 11/28/2014 0756   PLT 220 11/28/2014 0756   MCV 94.3 11/28/2014 0756   MCH 31.6 11/28/2014 0756   MCHC 33.5 11/28/2014 0756   RDW 13.2 11/28/2014 0756   LYMPHSABS 1.7 06/27/2010 1544   MONOABS 0.5 06/27/2010 1544   EOSABS 0.1 06/27/2010 1544   BASOSABS 0.0 06/27/2010 1544    BMET    Component Value Date/Time   NA 142 11/28/2014 0756   K 4.5 11/28/2014 0756   CL 103 11/28/2014 0756   CO2 30 11/28/2014 0756   GLUCOSE 82 11/28/2014 0756   BUN 13 11/28/2014 0756   CREATININE 0.83 11/28/2014 0756   CALCIUM 9.9 11/28/2014 0756   GFRNONAA >60 02/22/2008 1829   GFRAA  02/22/2008 1829    >60        The eGFR has been calculated using the MDRD equation. This calculation has not been validated in all clinical    BNP No results found for: BNP  ProBNP No results found for: PROBNP  Imaging: No results found.   Assessment & Plan:   No problem-specific Assessment & Plan notes found for this encounter.     Lauraine Rinne, NP 12/28/2017

## 2017-12-29 ENCOUNTER — Telehealth: Payer: Self-pay | Admitting: Pulmonary Disease

## 2017-12-29 MED ORDER — UMECLIDINIUM-VILANTEROL 62.5-25 MCG/INH IN AEPB: 1.0000 | INHALATION_SPRAY | Freq: Every day | RESPIRATORY_TRACT | 6 refills | Status: DC

## 2017-12-29 NOTE — Telephone Encounter (Signed)
Called and spoke with Patient.  She feels that Anoro is really working good for her and would like a prescription sent to [facility] on [address]. Per last OV with RA- Prescription sent.  Nothing further needed at this time.  Instructions      Return in about 3 months (around 12/26/2017) for TP.   Trial of ANORO once daily - call for Rx if this helps Lung function is stable

## 2018-03-23 ENCOUNTER — Ambulatory Visit (INDEPENDENT_AMBULATORY_CARE_PROVIDER_SITE_OTHER): Payer: Self-pay | Admitting: Family Medicine

## 2018-03-23 ENCOUNTER — Encounter: Payer: Self-pay | Admitting: Family Medicine

## 2018-03-23 VITALS — BP 124/76 | HR 96 | Temp 98.0°F | Resp 20 | Ht 68.0 in | Wt 154.0 lb

## 2018-03-23 DIAGNOSIS — L03213 Periorbital cellulitis: Secondary | ICD-10-CM

## 2018-03-23 DIAGNOSIS — H1033 Unspecified acute conjunctivitis, bilateral: Secondary | ICD-10-CM

## 2018-03-23 MED ORDER — AMOXICILLIN-POT CLAVULANATE 875-125 MG PO TABS: 1.0000 | ORAL_TABLET | Freq: Two times a day (BID) | ORAL | 0 refills | Status: DC

## 2018-03-23 MED ORDER — CIPROFLOXACIN HCL 0.3 % OP SOLN
OPHTHALMIC | 0 refills | Status: DC
Start: 2018-03-23 — End: 2018-03-30

## 2018-03-23 MED ORDER — CEFTRIAXONE SODIUM 1 G IJ SOLR
1.0000 g | Freq: Once | INTRAMUSCULAR | Status: AC
  Administered 2018-03-23: 1 g via INTRAMUSCULAR

## 2018-03-23 MED ORDER — CLINDAMYCIN HCL 300 MG PO CAPS: 300.0000 mg | ORAL_CAPSULE | Freq: Three times a day (TID) | ORAL | 0 refills | Status: DC

## 2018-03-23 NOTE — Patient Instructions (Signed)
Start both antibiotics as prescribed.  Start new eye drops.  If worsening, go to ED or any signs of fever, chills,nausea or pain with eye movement is cause for emergency--> ED.  Hydrate.  Start probiotics or activa yogurt.    Orbital Cellulitis Orbital cellulitis is an infection in the eye socket (orbit) and the tissues that surround the eye. The infection can spread to the eyelids, eyebrow area, and cheek. It can also cause a pocket of pus to develop around the eye (orbital abscess). In severe cases, the infection can spread to the brain. Orbital cellulitis is a medical emergency. What are the causes? The most common cause of this condition is a bacterial infection. The infection usually spreads to the eye socket from another part of the body. The infection may start in:  The nose or sinuses.  The eyelids.  Facial skin.  The bloodstream.  What increases the risk? This condition is more likely to develop in people who have recently had one of the following:  Upper respiratory infection.  Sinus infection.  Eyelid or facial infection.  Eye injury.  Infection that affects the entire body or the bloodstream (systemic infection).  What are the signs or symptoms? Symptoms of this condition usually start quickly. Symptoms include:  Eye pain that gets worse with eye movement.  Swelling around the eye.  Eye redness.  Bulging of the eye.  Inability to move the eye.  Double vision.  Fever.  How is this diagnosed? This condition may be diagnosed based on your symptoms and an eye exam. You may also have tests to confirm the diagnosis and to check for an orbital abscess. Other tests (cultures) may be done to find out what type of bacteria is causing the infection. Tests may include:  Complete blood count (CBC).  Blood culture.  Nose, sinus, or throat culture.  Imaging studies such as a CT scan or MRI.  How is this treated? This condition is usually treated in a  hospital. Antibiotic medicines are given directly into a vein through an IV tube.  At first, you may get IV antibiotics to kill bacteria that often cause orbital cellulitis (broad spectrum antibiotics).  Your medicine may be changed if cultures suggest that another antibiotic would be better.  If the IV antibiotics are working to treat your infection, you may be switched to oral antibiotics and allowed to go home.  In some cases, surgery may be needed to drain an orbital abscess.  Follow these instructions at home:  Take medicines only as directed by your health care provider.  Take your antibiotic medicine as directed by your health care provider. Finish the antibiotic even if you start to feel better.  Return to your normal activities as directed by your health care provider. Ask your health care provider what activities are safe for you.  Keep all follow-up visits as directed by your health care provider. This is important. Get help right away if:  Your eye pain or swelling returns or it gets worse.  You have any changes in your vision.  You have a fever. This information is not intended to replace advice given to you by your health care provider. Make sure you discuss any questions you have with your health care provider. Document Released: 07/08/2001 Document Revised: 12/20/2015 Document Reviewed: 07/10/2014 Elsevier Interactive Patient Education  [employer].

## 2018-03-23 NOTE — Progress Notes (Signed)
Patient ID: Amber Zavala, female  DOB: March 04, 1966, 52 y.o.   MRN: [id number] Patient Care Team    Relationship Specialty Notifications Start End  Ma Hillock, DO PCP - General Family Medicine  03/23/18   Paula Compton, MD Consulting Physician Obstetrics and Gynecology  03/24/18   Wallene Huh, [place] Consulting Physician Podiatry  03/24/18   Rigoberto Noel, MD Consulting Physician Pulmonary Disease  03/24/18   Troy Sine, MD Consulting Physician Cardiology  03/24/18     Chief Complaint  Patient presents with  . Establish Care  . Eye Problem    swollen bilateral    Subjective:  Amber Zavala is a 52 y.o.  female present for new patient re-establishment. All past medical history, surgical history, allergies, family history, immunizations, medications and social history were updated in the electronic medical record today. All recent labs, ED visits and hospitalizations within the last year were reviewed.  Conjunctivitis: pt reports  right eye redness and drainage started over a week ago. She was seen at an UC and started on polytrim drops. She reports the drops have not helped at all and now she has symptoms in both eyes. She was seen by her GYN for STD screening this week for vaginal discharge/odor with h/o new partner last 3 weeks and has started treatment with flagyl for BV. She reports G/C cultures were negative. She denies abd pain, fever, chills, headache, nausea or vomit. She reports her eyes do feel like sand paper, but vision is intact. Some blurriness right eye when there is more drainage. She reports both eyes are now matted shut in the morning. She is not a contact lens wearer.    Depression screen [facility] 2/9 03/23/2018  Decreased Interest 0  Down, Depressed, Hopeless 0  PHQ - 2 Score 0   No flowsheet data found.   Current Exercise Habits: The patient has a physically strenous job, but has no regular exercise apart from work. Exercise limited by: None  identified Fall Risk  03/23/2018  Falls in the past year? No     Immunization History  Administered Date(s) Administered  . Td 09/18/2009    No exam data present  Past Medical History:  Diagnosis Date  . COPD (chronic obstructive pulmonary disease) (HCC)    Dr. Elsworth Soho  . Dysplastic nevus 2012   trunk x3  . Hot flashes   . Hyperplastic colon polyp 2015   [facility]- [facility]  . Palpitation   . Sleep apnea    Dr. Elsworth Soho  . Tobacco abuse    No Known Allergies Past Surgical History:  Procedure Laterality Date  . APPENDECTOMY  1979  . BILATERAL TEMPOROMANDIBULAR JOINT ARTHROPLASTY    . BREAST ENHANCEMENT SURGERY    . CERVICAL BIOPSY  W/ LOOP ELECTRODE EXCISION  09/2001  . COLONOSCOPY W/ BIOPSIES  2015   hyperplastic polyp x1. [facility] ([facility])  . 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 RHINOPLASTY  2007  . TUBAL LIGATION  1997   Family History  Problem Relation Age of Onset  . Lung cancer Father   . Stroke Paternal Grandmother   . Lung cancer Paternal Grandfather   . Lupus Mother   . Arthritis Mother   . Colon cancer Neg Hx   . Esophageal cancer Neg Hx   . Rectal cancer Neg Hx   . Stomach cancer Neg Hx    Social History   Socioeconomic History  . Marital status:  Married    Spouse name: Not on file  . Number of children: Not on file  . Years of education: Not on file  . Highest education level: Not on file  Occupational History  . Occupation: Marine scientist: [employer]  . Financial resource strain: Not on file  . Food insecurity:    Worry: Not on file    Inability: Not on file  . Transportation needs:    Medical: Not on file    Non-medical: Not on file  Tobacco Use  . Smoking status: Current Every Day Smoker    Packs/day: 1.00    Years: 30.00    Pack years: 30.00    Types: Cigarettes  . Smokeless tobacco: Never Used  Substance and Sexual Activity  . Alcohol use: Yes    Alcohol/week: 0.0 standard drinks    Comment: 4  times a week  . Drug use: No  . Sexual activity: Yes    Partners: Male  Lifestyle  . Physical activity:    Days per week: Not on file    Minutes per session: Not on file  . Stress: Not on file  Relationships  . Social connections:    Talks on phone: Not on file    Gets together: Not on file    Attends religious service: Not on file    Active member of club or organization: Not on file    Attends meetings of clubs or organizations: Not on file    Relationship status: Not on file  . Intimate partner violence:    Fear of current or ex partner: Not on file    Emotionally abused: Not on file    Physically abused: Not on file    Forced sexual activity: Not on file  Other Topics Concern  . Not on file  Social History Narrative   Marital status/children/pets: Divorced, 1 child   Education/employment: Diploma, works in Press photographer.    Safety:      -smoke alarm in the home:Yes     - wears seatbelt: Yes     - Feels safe in their relationships: Yes   Allergies as of 03/23/2018   No Known Allergies     Medication List        Accurate as of 03/23/18 11:59 PM. Always use your most recent med list.          amoxicillin-clavulanate 875-125 MG tablet Commonly known as:  AUGMENTIN Take 1 tablet by mouth 2 (two) times daily.   ANORO ELLIPTA 62.5-25 MCG/INH Aepb Generic drug:  umeclidinium-vilanterol Inhale 1 puff into the lungs daily.   ciprofloxacin 0.3 % ophthalmic solution Commonly known as:  CILOXAN 2 drops each eye every 2 hrs while awake for 2 days, then 2 drops each eye every 4 hours while awake for 5 days.   clindamycin 300 MG capsule Commonly known as:  CLEOCIN Take 1 capsule (300 mg total) by mouth 3 (three) times daily.       All past medical history, surgical history, allergies, family history, immunizations andmedications were updated in the EMR today and reviewed under the history and medication portions of their EMR.    No results found for this or any previous  visit (from the past 2160 hour(s)).  ROS: 14 pt review of systems performed and negative (unless mentioned in an HPI)  Objective: BP 124/76 (BP Location: Right Arm, Patient Position: Sitting, Cuff Size: Normal)   Pulse 96   Temp 98  F (36.7 C)   Resp 20   Ht 5\' 8"  (1.727 m)   Wt 154 lb (69.9 kg)   SpO2 100%   BMI 23.42 kg/m  Gen: Afebrile. No acute distress. Nontoxic in appearance, well-developed, well-nourished,  Pleasant caucasian female.  HENT: AT. [place]. Bilateral TM visualized and normal in appearance, normal external auditory canal. MMM, no oral lesions, adequate dentition. Bilateral nares within normal limits. Throat without erythema, ulcerations or exudates. no Cough on exam, no hoarseness on exam. Eyes:Pupils Equal Round Reactive to light, Extraocular movements intact,  Conjunctiva with severe redness and drainage. no icterus. Neck/lymp/endocrine: Supple,no lymphadenopathy CV: RRR  Chest: CTAB, no wheeze, rhonchi or crackles.  Skin: no rashes, purpura or petechiae.  Neuro/Msk:  Normal gait. PERLA. EOMi. Alert. Oriented x3.   Psych: Normal affect, dress and demeanor. Normal speech. Normal thought content and judgment.  Assessment/plan: CHAQUITA BASQUES is a 52 y.o. female present for est care.  Periorbital cellulitis of right eye/bacterial conjuctivitis - started in right eye, now left eye infected. Stop polytrim- not helping. Periorbital cellulitis is present right. EOM without pain.  - IM rocephin provided today. Start cleocin/augmentin dual coverage for orbital cellulitis today.  - cipro eye drops, with instructions.  - hand hygiene.  She just started tx for BV by GYN. She states she was screened for chlamydia and Gonorrhea at GYN this week and both were negative. If not resolving will consider covering with azith 1 g or doxy. - start probiotics or activa.  - ciprofloxacin (CILOXAN) 0.3 % ophthalmic solution; 2 drops each eye every 2 hrs while awake for 2 days, then 2 drops  each eye every 4 hours while awake for 5 days.  Dispense: 5 mL; Refill: 0 - amoxicillin-clavulanate (AUGMENTIN) 875-125 MG tablet; Take 1 tablet by mouth 2 (two) times daily.  Dispense: 20 tablet; Refill: 0 - clindamycin (CLEOCIN) 300 MG capsule; Take 1 capsule (300 mg total) by mouth 3 (three) times daily.  Dispense: 30 capsule; Refill: 0 - cefTRIAXone (ROCEPHIN) injection 1 g - If worsening needs to be seen immediately in ED. She understands plan. If improving f/u 1 week for recheck.    Return in about 1 week (around 03/30/2018) for cellultis .  Greater than 30 minutes spent with patient, >50% of time spent face to face counseling and coordinating care.   Note is dictated utilizing voice recognition software. Although note has been proof read prior to signing, occasional typographical errors still can be missed. If any questions arise, please do not hesitate to call for verification.  Electronically signed by: Howard Pouch, DO [place]

## 2018-03-24 ENCOUNTER — Encounter: Payer: Self-pay | Admitting: Family Medicine

## 2018-03-26 ENCOUNTER — Telehealth: Payer: Self-pay | Admitting: Family Medicine

## 2018-03-26 MED ORDER — AZITHROMYCIN 500 MG PO TABS: 1000.0000 mg | ORAL_TABLET | Freq: Once | ORAL | 0 refills | Status: AC

## 2018-03-26 NOTE — Telephone Encounter (Signed)
Called in azith 1 g- which would treat if it was chlamydia related. Continue meds as prescribed otherwise. Report to ED if not improving over the weekend or if worsening.  Next step is ED and/or ophthalmologist.

## 2018-03-26 NOTE — Telephone Encounter (Signed)
Copied from [place] [phone]. Topic: Quick Communication - See Telephone Encounter >> Mar 26, 2018  4:21 PM Gardiner Ramus wrote: CRM for notification. See Telephone encounter for: 03/26/18. Pt was seen in office for pink eye 03/23/18. She states that her symptoms are the same and would like to know what to do. Please advise

## 2018-03-26 NOTE — Telephone Encounter (Signed)
Spoke with patient reviewed lab results and instructions. Patient verbalized understanding. 

## 2018-03-30 ENCOUNTER — Encounter: Payer: Self-pay | Admitting: Family Medicine

## 2018-03-30 ENCOUNTER — Ambulatory Visit (INDEPENDENT_AMBULATORY_CARE_PROVIDER_SITE_OTHER): Payer: Self-pay | Admitting: Family Medicine

## 2018-03-30 VITALS — BP 115/80 | HR 86 | Temp 98.1°F | Resp 18 | Ht 68.0 in | Wt 154.0 lb

## 2018-03-30 DIAGNOSIS — H538 Other visual disturbances: Secondary | ICD-10-CM

## 2018-03-30 DIAGNOSIS — L03213 Periorbital cellulitis: Secondary | ICD-10-CM

## 2018-03-30 DIAGNOSIS — H1033 Unspecified acute conjunctivitis, bilateral: Secondary | ICD-10-CM

## 2018-03-30 HISTORY — DX: Periorbital cellulitis: L03.213

## 2018-03-30 MED ORDER — FLUCONAZOLE 150 MG PO TABS: 150.0000 mg | ORAL_TABLET | Freq: Once | ORAL | 0 refills | Status: AC

## 2018-03-30 NOTE — Patient Instructions (Addendum)
J&J baby shampoo cleanses with warm water and disposal towels.  Hand hygiene.  You can stop the metronidazole, the clindamycin will provided coverage.  I will put a referral to Dr. Oswaldo Conroy in [place], you can make the appt for a few weeks out if desired.    I printed diflucan script in case you need it.  Please help [place] help you:  We are honored you have chosen [facility] for your Primary Care home. Below you will find basic instructions that you may need to access in the future. Please help [place] help you by reading the instructions, which cover many of the frequent questions we experience.   Prescription refills and request:  -In order to allow more efficient response time, please call your pharmacy for all refills. They will forward the request electronically to [place]. This allows for the quickest possible response. Request left on a nurse line can take longer to refill, since these are checked as time allows between office patients and other phone calls.  - refill request can take up to 3-5 working days to complete.  - If request is sent electronically and request is appropiate, it is usually completed in 1-2 business days.  - all patients will need to be seen routinely for all chronic medical conditions requiring prescription medications (see follow-up below). If you are overdue for follow up on your condition, you will be asked to make an appointment and we will call in enough medication to cover you until your appointment (up to 30 days).  - all controlled substances will require a face to face visit to request/refill.  - if you desire your prescriptions to go through a new pharmacy, and have an active script at original pharmacy, you will need to call your pharmacy and have scripts transferred to new pharmacy. This is completed between the pharmacy locations and not by your provider.    Results: If any images or labs were ordered, it can take up to 1 week to get results depending on  the test ordered and the lab/facility running and resulting the test. - Normal or stable results, which do not need further discussion, may be released to your mychart immediately with attached note to you. A call may not be generated for normal results. Please make certain to sign up for mychart. If you have questions on how to activate your mychart you can call the front office.  - If your results need further discussion, our office will attempt to contact you via phone, and if unable to reach you after 2 attempts, we will release your abnormal result to your mychart with instructions.  - All results will be automatically released in mychart after 1 week.  - Your provider will provide you with explanation and instruction on all relevant material in your results. Please keep in mind, results and labs may appear confusing or abnormal to the untrained eye, but it does not mean they are actually abnormal for you personally. If you have any questions about your results that are not covered, or you desire more detailed explanation than what was provided, you should make an appointment with your provider to do so.   Our office handles many outgoing and incoming calls daily. If we have not contacted you within 1 week about your results, please check your mychart to see if there is a message first and if not, then contact our office.  In helping with this matter, you help decrease call volume, and therefore allow  [place] to be able to respond to patients needs more efficiently.   Acute office visits (sick visit):  An acute visit is intended for a new problem and are scheduled in shorter time slots to allow schedule openings for patients with new problems. This is the appropriate visit to discuss a new problem. Problems will not be addressed by phone call or Echart message. Appointment is needed if requesting treatment. In order to provide you with excellent quality medical care with proper time for you to explain your  problem, have an exam and receive treatment with instructions, these appointments should be limited to one new problem per visit. If you experience a new problem, in which you desire to be addressed, please make an acute office visit, we save openings on the schedule to accommodate you. Please do not save your new problem for any other type of visit, let us take care of it properly and quickly for you.   Follow up visits:  Depending on your condition(s) your provider will need to see you routinely in order to provide you with quality care and prescribe medication(s). Most chronic conditions (Example: hypertension, Diabetes, depression/anxiety... etc), require visits a couple times a year. Your provider will instruct you on proper follow up for your personal medical conditions and history. Please make certain to make follow up appointments for your condition as instructed. Failing to do so could result in lapse in your medication treatment/refills. If you request a refill, and are overdue to be seen on a condition, we will always provide you with a 30 day script (once) to allow you time to schedule.    Medicare wellness (well visit): - we have a wonderful Nurse Maudie Mercury), that will meet with you and provide you will yearly medicare wellness visits. These visits should occur yearly (can not be scheduled less than 1 calendar year apart) and cover preventive health, immunizations, advance directives and screenings you are entitled to yearly through your medicare benefits. Do not miss out on your entitled benefits, this is when medicare will pay for these benefits to be ordered for you.  These are strongly encouraged by your provider and is the appropriate type of visit to make certain you are up to date with all preventive health benefits. If you have not had your medicare wellness exam in the last 12 months, please make certain to schedule one by calling the office and schedule your medicare wellness with Maudie Mercury as  soon as possible.   Yearly physical (well visit):  - Adults are recommended to be seen yearly for physicals. Check with your insurance and date of your last physical, most insurances require one calendar year between physicals. Physicals include all preventive health topics, screenings, medical exam and labs that are appropriate for gender/age and history. You may have fasting labs needed at this visit. This is a well visit (not a sick visit), new problems should not be covered during this visit (see acute visit).  - Pediatric patients are seen more frequently when they are younger. Your provider will advise you on well child visit timing that is appropriate for your their age. - This is not a medicare wellness visit. Medicare wellness exams do not have an exam portion to the visit. Some medicare companies allow for a physical, some do not allow a yearly physical. If your medicare allows a yearly physical you can schedule the medicare wellness with our nurse Maudie Mercury and have your physical with your provider after, on the same day. Please  check with insurance for your full benefits.   Late Policy/No Shows:  - all new patients should arrive 15-30 minutes earlier than appointment to allow [place] time  to  obtain all personal demographics,  insurance information and for you to complete office paperwork. - All established patients should arrive 10-15 minutes earlier than appointment time to update all information and be checked in .  - In our best efforts to run on time, if you are late for your appointment you will be asked to either reschedule or if able, we will work you back into the schedule. There will be a wait time to work you back in the schedule,  depending on availability.  - If you are unable to make it to your appointment as scheduled, please call 24 hours ahead of time to allow [place] to fill the time slot with someone else who needs to be seen. If you do not cancel your appointment ahead of time, you may be  charged a no show fee.

## 2018-03-30 NOTE — Progress Notes (Signed)
Patient ID: Amber Zavala, female  DOB: [birth year], 52 y.o.   MRN: [id number] Patient Care Team    Relationship Specialty Notifications Start End  Ma Hillock, DO PCP - General Family Medicine  03/23/18   Paula Compton, MD Consulting Physician Obstetrics and Gynecology  03/24/18   Wallene Huh, [place] Consulting Physician Podiatry  03/24/18   Rigoberto Noel, MD Consulting Physician Pulmonary Disease  03/24/18   Troy Sine, MD Consulting Physician Cardiology  03/24/18     Chief Complaint  Patient presents with  . Follow-up    right eye cellulitis    Subjective:  Amber Zavala is a 52 y.o.  female present for follow up in right eye.  Conjunctivitis/periorbital cellulitis:  Pt reports her eyes are much improved. She is continuing the cleocin and Augmentin as prescribed, as well as her cipro eye drops. She was worried she did not see great improvement and azith 1 gm was called in for possible chlamydia coverage Friday. She is uncertain if it was the azith or her other abx kicked in at same time, but after azith she started seeing improvement. She reports mild blurriness in vision right eye only.  Prior note:  pt reports  right eye redness and drainage started over a week ago. She was seen at an UC and started on polytrim drops. She reports the drops have not helped at all and now she has symptoms in both eyes. She was seen by her GYN for STD screening this week for vaginal discharge/odor with h/o new partner last 3 weeks and has started treatment with flagyl for BV. She reports G/C cultures were negative. She denies abd pain, fever, chills, headache, nausea or vomit. She reports her eyes do feel like sand paper, but vision is intact. Some blurriness right eye when there is more drainage. She reports both eyes are now matted shut in the morning. She is not a contact lens wearer.    Depression screen [facility] 2/9 03/23/2018  Decreased Interest 0  Down, Depressed, Hopeless 0  PHQ  - 2 Score 0   No flowsheet data found.     Fall Risk  03/23/2018  Falls in the past year? No   Immunization History  Administered Date(s) Administered  . Td 09/18/2009   No exam data present  Past Medical History:  Diagnosis Date  . COPD (chronic obstructive pulmonary disease) (HCC)    Dr. Elsworth Soho  . Dysplastic nevus 2012   trunk x3  . Hot flashes   . Hyperplastic colon polyp 2015   [facility]- [facility]  . Palpitation   . Sleep apnea    Dr. Elsworth Soho  . Tobacco abuse    No Known Allergies Past Surgical History:  Procedure Laterality Date  . APPENDECTOMY  1979  . BILATERAL TEMPOROMANDIBULAR JOINT ARTHROPLASTY    . BREAST ENHANCEMENT SURGERY    . CERVICAL BIOPSY  W/ LOOP ELECTRODE EXCISION  09/2001  . COLONOSCOPY W/ BIOPSIES  2015   hyperplastic polyp x1. [facility] ([facility])  . 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 RHINOPLASTY  2007  . TUBAL LIGATION  1997   Family History  Problem Relation Age of Onset  . Lung cancer Father   . Stroke Paternal Grandmother   . Lung cancer Paternal Grandfather   . Lupus Mother   . Arthritis Mother   . Colon cancer Neg Hx   . Esophageal cancer Neg Hx   . Rectal cancer  Neg Hx   . Stomach cancer Neg Hx    Social History   Socioeconomic History  . Marital status: Married    Spouse name: Not on file  . Number of children: Not on file  . Years of education: Not on file  . Highest education level: Not on file  Occupational History  . Occupation: Marine scientist: [employer]  . Financial resource strain: Not on file  . Food insecurity:    Worry: Not on file    Inability: Not on file  . Transportation needs:    Medical: Not on file    Non-medical: Not on file  Tobacco Use  . Smoking status: Current Every Day Smoker    Packs/day: 1.00    Years: 30.00    Pack years: 30.00    Types: Cigarettes  . Smokeless tobacco: Never Used  Substance and Sexual Activity  . Alcohol use: Yes    Alcohol/week: 0.0  standard drinks    Comment: 4 times a week  . Drug use: No  . Sexual activity: Yes    Partners: Male  Lifestyle  . Physical activity:    Days per week: Not on file    Minutes per session: Not on file  . Stress: Not on file  Relationships  . Social connections:    Talks on phone: Not on file    Gets together: Not on file    Attends religious service: Not on file    Active member of club or organization: Not on file    Attends meetings of clubs or organizations: Not on file    Relationship status: Not on file  . Intimate partner violence:    Fear of current or ex partner: Not on file    Emotionally abused: Not on file    Physically abused: Not on file    Forced sexual activity: Not on file  Other Topics Concern  . Not on file  Social History Narrative   Marital status/children/pets: Divorced, 1 child   Education/employment: Diploma, works in Press photographer.    Safety:      -smoke alarm in the home:Yes     - wears seatbelt: Yes     - Feels safe in their relationships: Yes   Allergies as of 03/30/2018   No Known Allergies     Medication List        Accurate as of 03/30/18  4:21 PM. Always use your most recent med list.          ANORO ELLIPTA 62.5-25 MCG/INH Aepb Generic drug:  umeclidinium-vilanterol Inhale 1 puff into the lungs daily.   fluconazole 150 MG tablet Commonly known as:  DIFLUCAN Take 1 tablet (150 mg total) by mouth once for 1 dose.       All past medical history, surgical history, allergies, family history, immunizations andmedications were updated in the EMR today and reviewed under the history and medication portions of their EMR.    No results found for this or any previous visit (from the past 2160 hour(s)).  ROS: 14 pt review of systems performed and negative (unless mentioned in an HPI)  Objective: BP 115/80 (BP Location: Right Arm, Patient Position: Sitting, Cuff Size: Normal)   Pulse 86   Temp 98.1 F (36.7 C)   Resp 18   Ht 5\' 8"  (1.727 m)    Wt 154 lb (69.9 kg)   SpO2 100%   BMI 23.42 kg/m   Gen: Afebrile.  No acute distress. Nontoxic.  HENT: AT. [place].  MMM. Bilateral nares without erythema or drainage. Throat without erythema or exudates. No cough.  Eyes:Pupils Equal Round Reactive to light, Extraocular movements intact,  Right medial Conjunctiva redness only, no draiange or icterus. Neck/lymp/endocrine: Supple,no lymphadenopathy Skin: no rashes, purpura or petechiae.  Neuro:  Normal gait. PERLA. EOMi. Alert. Oriented x3  Assessment/plan: Amber Zavala is a 52 y.o. female present for est care.  Periorbital cellulitis of right eye/bacterial conjunctivitis/blurry vision - improving. finish cleocin/augmentin dual coverage for orbital cellulitis. Also treated with azith 1 g when she did not see improvement before the holiday weekend. Uncertain if responded to mostly azith --> suggesting poss Chlamydia + or the dual coverage just kicked in at that time. Either way, she is improving. She has some residual swelling right eye, and medial conjunctival redness still present. Overall--> greatly improved.  - hand hygiene. J&J baby shampoo cleanses daily (disposable towel)  - Continue  probiotics or activa.  - diflucan prescribed and printed in the event she does develop a yeast infection.  - Ophthalmology referral placed for formal eye exam with her reports of mild blurriness in her right eye.   Return if symptoms worsen or fail to improve.    Note is dictated utilizing voice recognition software. Although note has been proof read prior to signing, occasional typographical errors still can be missed. If any questions arise, please do not hesitate to call for verification.  Electronically signed by: Howard Pouch, DO [place]

## 2019-01-13 ENCOUNTER — Telehealth: Payer: Self-pay | Admitting: Pulmonary Disease

## 2019-01-13 NOTE — Telephone Encounter (Signed)
Returned call to patient who saw RA in 2017 last. Pt wants to start cpap therapy. She was advised last sleep study 2007 and it may be best to do another study for best fit settings Patient really does not want another study. Suggested discussing further at ov. Appt scheduled nothing further needed.

## 2019-01-19 ENCOUNTER — Ambulatory Visit: Payer: Self-pay | Admitting: Primary Care

## 2019-02-02 ENCOUNTER — Other Ambulatory Visit: Payer: Self-pay

## 2019-02-02 ENCOUNTER — Ambulatory Visit: Payer: 59 | Admitting: Primary Care

## 2019-02-02 ENCOUNTER — Encounter: Payer: Self-pay | Admitting: Primary Care

## 2019-02-02 VITALS — BP 124/80 | HR 71 | Temp 98.3°F | Ht 68.0 in | Wt 154.8 lb

## 2019-02-02 DIAGNOSIS — G4733 Obstructive sleep apnea (adult) (pediatric): Secondary | ICD-10-CM | POA: Diagnosis not present

## 2019-02-02 DIAGNOSIS — Z8709 Personal history of other diseases of the respiratory system: Secondary | ICD-10-CM | POA: Diagnosis not present

## 2019-02-02 DIAGNOSIS — Z720 Tobacco use: Secondary | ICD-10-CM

## 2019-02-02 NOTE — Assessment & Plan Note (Signed)
-   Mostly asymptomatic - No benefit from Anoro, patient stopped taking

## 2019-02-02 NOTE — Progress Notes (Signed)
_0  ID: [name], female    DOB: April 08, 1966, 53 y.o.   MRN: [id number]  No chief complaint on file.   Referring provider: Ma Hillock, DO  HPI: 53 year old female, current everyday smoker (30 pack year hx). PMH COPD, OSA. Patient of Dr. Elsworth Soho, last seen 09/25/17. PSG 2007 AHI 25/h. Given sample of ANORO.   02/02/2019 Patient interested in starting CPAP therapy. States that she has a hx of OSA but did not want to wear CPAP in the past. She now thinks she needs to use device in order to get better night sleep. States that she wakes up 5-7 times a night. Feels awful. Continues smoking 1ppd. Reports no benefit from Anoro. No significant breathing issues, some dyspnea on exertion but this doesn't seem to bother her much. Epworth 10.    Significant tests/ events reviewed 09/2017 spirometry shows ratio 73, FEV1 of 73% and FVC of 79%, surprisingly better  10/2015 Spirometry  FEV1 63% with ratio 51 and FVC 100%, some flattening of the expiratory loop  No Known Allergies  Immunization History  Administered Date(s) Administered  . Td 09/18/2009    Past Medical History:  Diagnosis Date  . COPD (chronic obstructive pulmonary disease) (HCC)    Dr. Elsworth Soho  . Dysplastic nevus 2012   trunk x3  . Hot flashes   . Hyperplastic colon polyp 2015   [facility]- [facility]  . Palpitation   . Sleep apnea    Dr. Elsworth Soho  . Tobacco abuse     Tobacco History: Social History   Tobacco Use  Smoking Status Current Every Day Smoker  . Packs/day: 1.00  . Years: 30.00  . Pack years: 30.00  . Types: Cigarettes  Smokeless Tobacco Never Used   Ready to quit: Not Answered Counseling given: Not Answered   Outpatient Medications Prior to Visit  Medication Sig Dispense Refill  . umeclidinium-vilanterol (ANORO ELLIPTA) 62.5-25 MCG/INH AEPB Inhale 1 puff into the lungs daily.     No facility-administered medications prior to visit.     Review of Systems  Review of Systems  Constitutional:  Negative.   Respiratory: Negative for cough, shortness of breath and wheezing.   Cardiovascular: Negative.   Psychiatric/Behavioral: Positive for sleep disturbance.    Physical Exam  BP 124/80 (BP Location: Left Arm, Patient Position: Sitting, Cuff Size: Normal)   Pulse 71   Temp 98.3 F (36.8 C) (Oral)   Ht _1  (1.727 m)   Wt 154 lb 12.8 oz (70.2 kg)   SpO2 100%   BMI 23.54 kg/m  Physical Exam Constitutional:      Appearance: She is well-developed.  HENT:     Head: Normocephalic and atraumatic.  Eyes:     Pupils: Pupils are equal, round, and reactive to light.  Neck:     Musculoskeletal: Normal range of motion and neck supple.  Cardiovascular:     Rate and Rhythm: Normal rate and regular rhythm.     Heart sounds: Normal heart sounds. No murmur.  Pulmonary:     Effort: Pulmonary effort is normal. No respiratory distress.     Breath sounds: Normal breath sounds. No wheezing.  Skin:    General: Skin is warm and dry.     Findings: No erythema or rash.  Neurological:     General: No focal deficit present.     Mental Status: She is alert and oriented to person, place, and time. Mental status is at baseline.  Psychiatric:  Mood and Affect: Mood normal.        Behavior: Behavior normal.        Thought Content: Thought content normal.        Judgment: Judgment normal.      Lab Results:  CBC    Component Value Date/Time   WBC 3.8 (L) 11/28/2014 0756   RBC 5.07 11/28/2014 0756   HGB 16.0 (H) 11/28/2014 0756   HCT 47.8 (H) 11/28/2014 0756   PLT 220 11/28/2014 0756   MCV 94.3 11/28/2014 0756   MCH 31.6 11/28/2014 0756   MCHC 33.5 11/28/2014 0756   RDW 13.2 11/28/2014 0756   LYMPHSABS 1.7 06/27/2010 1544   MONOABS 0.5 06/27/2010 1544   EOSABS 0.1 06/27/2010 1544   BASOSABS 0.0 06/27/2010 1544    BMET    Component Value Date/Time   NA 142 11/28/2014 0756   K 4.5 11/28/2014 0756   CL 103 11/28/2014 0756   CO2 30 11/28/2014 0756   GLUCOSE 82 11/28/2014  0756   BUN 13 11/28/2014 0756   CREATININE 0.83 11/28/2014 0756   CALCIUM 9.9 11/28/2014 0756   GFRNONAA >60 02/22/2008 1829   GFRAA  02/22/2008 1829    >60        The eGFR has been calculated using the MDRD equation. This calculation has not been validated in all clinical    BNP No results found for: BNP  ProBNP No results found for: PROBNP  Imaging: No results found.   Assessment & Plan:   COPD (chronic obstructive pulmonary disease) ([place]) - Mostly asymptomatic - No benefit from Anoro, patient stopped taking  OSA (obstructive sleep apnea) - NPSG 2007 showed AHI 25 - Patient interested in starting CPAP therapy, never treated  - Epworth 10  - Needs split-night sleep study  - FU after results   Tobacco use - Continues smoking 1ppd  - Encouraged smoking cessation, patient education material given    Martyn Ehrich, NP 02/02/2019

## 2019-02-02 NOTE — Patient Instructions (Addendum)
Order: Split night sleep study re: hx OSA/copd  Encourage smoking cessation (taper down amount and pick quit date)  Follow-up: We will call you after sleep test has been read with next steps    Steps to Quit Smoking Smoking tobacco is the leading cause of preventable death. It can affect almost every organ in the body. Smoking puts you and people around you at risk for many serious, long-lasting (chronic) diseases. Quitting smoking can be hard, but it is one of the best things that you can do for your health. It is never too late to quit. How do I get ready to quit? When you decide to quit smoking, make a plan to help you succeed. Before you quit:  Pick a date to quit. Set a date within the next 2 weeks to give you time to prepare.  Write down the reasons why you are quitting. Keep this list in places where you will see it often.  Tell your family, friends, and co-workers that you are quitting. Their support is important.  Talk with your doctor about the choices that may help you quit.  Find out if your health insurance will pay for these treatments.  Know the people, places, things, and activities that make you want to smoke (triggers). Avoid them. What first steps can I take to quit smoking?  Throw away all cigarettes at home, at work, and in your car.  Throw away the things that you use when you smoke, such as ashtrays and lighters.  Clean your car. Make sure to empty the ashtray.  Clean your home, including curtains and carpets. What can I do to help me quit smoking? Talk with your doctor about taking medicines and seeing a counselor at the same time. You are more likely to succeed when you do both.  If you are pregnant or breastfeeding, talk with your doctor about counseling or other ways to quit smoking. Do not take medicine to help you quit smoking unless your doctor tells you to do so. To quit smoking: Quit right away  Quit smoking totally, instead of slowly cutting  back on how much you smoke over a period of time.  Go to counseling. You are more likely to quit if you go to counseling sessions regularly. Take medicine You may take medicines to help you quit. Some medicines need a prescription, and some you can buy over-the-counter. Some medicines may contain a drug called nicotine to replace the nicotine in cigarettes. Medicines may:  Help you to stop having the desire to smoke (cravings).  Help to stop the problems that come when you stop smoking (withdrawal symptoms). Your doctor may ask you to use:  Nicotine patches, gum, or lozenges.  Nicotine inhalers or sprays.  Non-nicotine medicine that is taken by mouth. Find resources Find resources and other ways to help you quit smoking and remain smoke-free after you quit. These resources are most helpful when you use them often. They include:  Online chats with a Social worker.  Phone quitlines.  Printed Furniture conservator/restorer.  Support groups or group counseling.  Text messaging programs.  Mobile phone apps. Use apps on your mobile phone or tablet that can help you stick to your quit plan. There are many free apps for mobile phones and tablets as well as websites. Examples include Quit Guide from the [employer] and smokefree.gov  What things can I do to make it easier to quit?   Talk to your family and friends. Ask them to support and  encourage you.  Call a phone quitline (1-800-QUIT-NOW), reach out to support groups, or work with a Social worker.  Ask people who smoke to not smoke around you.  Avoid places that make you want to smoke, such as: ? Bars. ? Parties. ? Smoke-break areas at work.  Spend time with people who do not smoke.  Lower the stress in your life. Stress can make you want to smoke. Try these things to help your stress: ? Getting regular exercise. ? Doing deep-breathing exercises. ? Doing yoga. ? Meditating. ? Doing a body scan. To do this, close your eyes, focus on one area of  your body at a time from head to toe. Notice which parts of your body are tense. Try to relax the muscles in those areas. How will I feel when I quit smoking? Day 1 to 3 weeks Within the first 24 hours, you may start to have some problems that come from quitting tobacco. These problems are very bad 2-3 days after you quit, but they do not often last for more than 2-3 weeks. You may get these symptoms:  Mood swings.  Feeling restless, nervous, angry, or annoyed.  Trouble concentrating.  Dizziness.  Strong desire for high-sugar foods and nicotine.  Weight gain.  Trouble pooping (constipation).  Feeling like you may vomit (nausea).  Coughing or a sore throat.  Changes in how the medicines that you take for other issues work in your body.  Depression.  Trouble sleeping (insomnia). Week 3 and afterward After the first 2-3 weeks of quitting, you may start to notice more positive results, such as:  Better sense of smell and taste.  Less coughing and sore throat.  Slower heart rate.  Lower blood pressure.  Clearer skin.  Better breathing.  Fewer sick days. Quitting smoking can be hard. Do not give up if you fail the first time. Some people need to try a few times before they succeed. Do your best to stick to your quit plan, and talk with your doctor if you have any questions or concerns. Summary  Smoking tobacco is the leading cause of preventable death. Quitting smoking can be hard, but it is one of the best things that you can do for your health.  When you decide to quit smoking, make a plan to help you succeed.  Quit smoking right away, not slowly over a period of time.  When you start quitting, seek help from your doctor, family, or friends. This information is not intended to replace advice given to you by your health care provider. Make sure you discuss any questions you have with your health care provider. Document Released: 05/10/2009 Document Revised:  10/01/2018 Document Reviewed: 10/02/2018 Elsevier Patient Education  2020 [employer].

## 2019-02-02 NOTE — Assessment & Plan Note (Addendum)
-   NPSG 2007 showed AHI 25 - Patient interested in starting CPAP therapy, never treated  - Epworth 10  - Needs split-night sleep study  - FU after results

## 2019-02-02 NOTE — Assessment & Plan Note (Addendum)
-   Continues smoking 1ppd  - Encouraged smoking cessation, patient education material given

## 2019-02-04 ENCOUNTER — Telehealth: Payer: Self-pay | Admitting: Primary Care

## 2019-02-04 NOTE — Telephone Encounter (Signed)
Called and spoke with pt letting her know that Vallarie Mare had sent the order for the sleep study to the place at her request. Stated to pt when Vallarie Mare called the number she gave her, she was told that pt would need to be covid tested prior to the study.  I told pt numerous times due to covid cases increasing still, all procedures (sleep study, PFT, etc) require pts to have covid test prior. Pt verbalized understanding and stated she would call the person back who originally had called her to schedule the covid test. Nothing further needed.

## 2019-02-04 NOTE — Telephone Encounter (Signed)
This was actually [facility] and they would have to covid test her since it is a procedure I called the number the patient gave me [phone] so I did not resc her sleep study here. Not sure where to go with this as of now.

## 2019-02-04 NOTE — Telephone Encounter (Signed)
Called and spoke with pt. Pt stated that she has issues with having to get COVID tested prior to having to have the split night study performed. Pt stated that she refuses to get COVID tested.  Due to this, pt is wanting to go to [facility] due to them not requiring a COVID test to be performed per pt.  Pt wants to know if we will be able to get it worked out to where she can get the split night study performed at [facility] or if she would have to have another doctor get this worked out for her. [facility], please advise on this. Thanks!

## 2019-02-13 LAB — RESULTS CONSOLE HPV: CHL HPV: NEGATIVE

## 2019-02-18 ENCOUNTER — Other Ambulatory Visit (HOSPITAL_COMMUNITY)
Admission: RE | Admit: 2019-02-18 | Discharge: 2019-02-18 | Disposition: A | Discharge status: Home / Self Care | Payer: 59 | Source: Ambulatory Visit | Attending: Internal Medicine | Admitting: Internal Medicine

## 2019-02-18 DIAGNOSIS — Z1159 Encounter for screening for other viral diseases: Secondary | ICD-10-CM | POA: Diagnosis present

## 2019-02-19 LAB — SARS CORONAVIRUS 2 (TAT 6-24 HRS): SARS Coronavirus 2: NEGATIVE

## 2019-02-20 ENCOUNTER — Ambulatory Visit (HOSPITAL_BASED_OUTPATIENT_CLINIC_OR_DEPARTMENT_OTHER): Payer: 59 | Attending: Primary Care | Admitting: Internal Medicine

## 2019-02-20 ENCOUNTER — Other Ambulatory Visit: Payer: Self-pay

## 2019-02-20 DIAGNOSIS — G4733 Obstructive sleep apnea (adult) (pediatric): Secondary | ICD-10-CM | POA: Diagnosis present

## 2019-02-20 DIAGNOSIS — Z8709 Personal history of other diseases of the respiratory system: Secondary | ICD-10-CM | POA: Diagnosis not present

## 2019-02-21 ENCOUNTER — Other Ambulatory Visit (HOSPITAL_BASED_OUTPATIENT_CLINIC_OR_DEPARTMENT_OTHER): Payer: Self-pay

## 2019-02-21 DIAGNOSIS — G4733 Obstructive sleep apnea (adult) (pediatric): Secondary | ICD-10-CM

## 2019-02-21 DIAGNOSIS — Z8709 Personal history of other diseases of the respiratory system: Secondary | ICD-10-CM

## 2019-02-23 ENCOUNTER — Telehealth: Payer: Self-pay | Admitting: Primary Care

## 2019-02-23 DIAGNOSIS — G4733 Obstructive sleep apnea (adult) (pediatric): Secondary | ICD-10-CM | POA: Diagnosis not present

## 2019-02-23 DIAGNOSIS — Z8709 Personal history of other diseases of the respiratory system: Secondary | ICD-10-CM | POA: Diagnosis not present

## 2019-02-23 NOTE — Telephone Encounter (Signed)
Called and spoke with pt letting her know the results of the sleep study and gave her the different options for treatment based on the results. Pt said that she wanted to go ahead and begin CPAP as she is not sleeping at night.  I have made pt a f/u appt after the start of CPAP so we can check compliance.  Beth, please advise settings for cpap. Thanks!

## 2019-02-23 NOTE — Telephone Encounter (Signed)
That sound good. Please place order for DME referral new CPAP start. Auto titrate 5-15cm h20, mask of choice, humidification and supplies. Thank you

## 2019-02-23 NOTE — Telephone Encounter (Signed)
Please let patient know sleep study showed mild obstructive sleep apnea. AHI 10.1. Minimum SpO2 during sleep was 89%.   Treatment includes observation, weight loss and sleep position off back. Other options may include CPAP, a fitted oral appliance, or ENT evaluation.   We can go ahead and send in order DME to start CPAP if she would like or she can make an apt to discuss treatment options further.

## 2019-02-23 NOTE — Procedures (Signed)
Patient Name: Amber Zavala, Amber Zavala Date: 02/20/2019 Gender: Female D.O.B: [birth year] Age (years): 36 Referring Provider: Geraldo Pitter NP Height (inches): 68 Interpreting Physician: Baird Lyons MD, ABSM Weight (lbs): 150 RPSGT: Gwenyth Allegra BMI: 23 MRN: [id number] Neck Size: 15.00  CLINICAL INFORMATION Sleep Study Type: NPSG Indication for sleep study: COPD, OSA Epworth Sleepiness Score: 13  SLEEP STUDY TECHNIQUE As per the AASM Manual for the Scoring of Sleep and Associated Events v2.3 (April 2016) with a hypopnea requiring 4% desaturations.  The channels recorded and monitored were frontal, central and occipital EEG, electrooculogram (EOG), submentalis EMG (chin), nasal and oral airflow, thoracic and abdominal wall motion, anterior tibialis EMG, snore microphone, electrocardiogram, and pulse oximetry.  MEDICATIONS Medications self-administered by patient taken the night of the study : none reported  SLEEP ARCHITECTURE The study was initiated at 9:47:34 PM and ended at 4:30:33 AM.  Sleep onset time was 12.3 minutes and the sleep efficiency was 76.9%%. The total sleep time was 309.7 minutes.  Stage REM latency was 94.5 minutes.  The patient spent 14.9%% of the night in stage N1 sleep, 68.5%% in stage N2 sleep, 0.0%% in stage N3 and 16.6% in REM.  Alpha intrusion was absent.  Supine sleep was 9.36%.  RESPIRATORY PARAMETERS The overall apnea/hypopnea index (AHI) was 10.1 per hour. There were 51 total apneas, including 40 obstructive, 11 central and 0 mixed apneas. There were 1 hypopneas and 144 RERAs.  The AHI during Stage REM sleep was 26.8 per hour.  AHI while supine was 45.5 per hour.  The mean oxygen saturation was 97.7%. The minimum SpO2 during sleep was 89.0%.  loud snoring was noted during this study.  CARDIAC DATA The 2 lead EKG demonstrated sinus rhythm. The mean heart rate was 61.5 beats per minute. Other EKG findings include: None.  LEG  MOVEMENT DATA The total PLMS were 0 with a resulting PLMS index of 0.0. Associated arousal with leg movement index was 4.5 .  IMPRESSIONS - Mild obstructive sleep apnea occurred during this study (AHI = 10.1/h). - Insufficient early events to meet protocol requirements for split CPAP titration. - No significant central sleep apnea occurred during this study (CAI = 2.1/h). - The patient had minimal oxygen desaturation during the study (Min O2 = 89.0%)  Mean sat 97.7%. - The patient snored with loud snoring volume. - No cardiac abnormalities were noted during this study. - Clinically significant periodic limb movements did not occur during sleep. No significant associated arousals.  DIAGNOSIS - Obstructive Sleep Apnea (327.23 [G47.33 ICD-10])  RECOMMENDATIONS - Treatment for mild OSA is directed at symptoms. Conservative options may include observation, weight loss and sleep position off back. - Other options may include CPAP, a fitted oral appliance, or ENT evaluation, based on clinical judgment.. - Be careful with alcohol, sedatives and other CNS depressants that may worsen sleep apnea and disrupt normal sleep architecture. - Sleep hygiene should be reviewed to assess factors that may improve sleep quality. - Weight management and regular exercise should be initiated or continued if appropriate.  [Electronically signed] 02/23/2019 01:39 PM  Baird Lyons MD, [place], American Board of Sleep Medicine   NPI: [id number]                          [place] [place] [facility]  ELECTRONICALLY SIGNED ON:  02/23/2019, 1:35 PM [place] PH: [phone]   FX: [phone] [place]  ACADEMY OF SLEEP MEDICINE

## 2019-02-23 NOTE — Telephone Encounter (Signed)
Order has been placed for pt's cpap start. Nothing further needed.

## 2019-04-25 ENCOUNTER — Other Ambulatory Visit: Payer: Self-pay | Admitting: *Deleted

## 2019-04-25 ENCOUNTER — Telehealth: Payer: Self-pay | Admitting: Family Medicine

## 2019-04-25 DIAGNOSIS — Z20822 Contact with and (suspected) exposure to covid-19: Secondary | ICD-10-CM

## 2019-04-25 NOTE — Telephone Encounter (Signed)
FYI- Patient is having SOB, chills, sore throat. Patient is going to go over to the [facility] for a test.

## 2019-04-26 LAB — NOVEL CORONAVIRUS, NAA: SARS-CoV-2, NAA: NOT DETECTED

## 2019-05-09 ENCOUNTER — Ambulatory Visit: Payer: 59 | Admitting: Primary Care

## 2019-05-09 NOTE — Progress Notes (Deleted)
'@Patient'  ID: Amber Zavala, female    DOB: [birth year], 53 y.o.   MRN: [id number]  No chief complaint on file.   Referring provider: Ma Hillock, DO  HPI: 53 year old female, current everyday smoker (30 pack year hx). PMH COPD, OSA. Patient of Dr. Elsworth Soho, last seen 09/25/17. PSG 2007 AHI 25/h. Split night sleep study 02/20/19 showed mild obstructive sleep apnea, AHI 10.1. CPAP started July 2020. Tried Anoro, no reported benefit.   Previous [facility] pulmonary encounter: 02/02/2019 Patient interested in starting CPAP therapy. States that she has a hx of OSA but did not want to wear CPAP in the past. She now thinks she needs to use device in order to get better night sleep. States that she wakes up 5-7 times a night. Feels awful. Continues smoking 1ppd. Reports no benefit from Anoro. No significant breathing issues, some dyspnea on exertion but this doesn't seem to bother her much. Epworth 10.   05/09/2019 Patient presents today for OSA follow-up.        Significant tests/ events reviewed 09/2017 spirometry shows ratio 73, FEV1 of 73% and FVC of 79%, surprisingly better  10/2015 Spirometry  FEV1 63% with ratio 51 and FVC 100%, some flattening of the expiratory loop  No Known Allergies  Immunization History  Administered Date(s) Administered  . Td 09/18/2009    Past Medical History:  Diagnosis Date  . COPD (chronic obstructive pulmonary disease) (HCC)    Dr. Elsworth Soho  . Dysplastic nevus 2012   trunk x3  . Hot flashes   . Hyperplastic colon polyp 2015   [facility]- [facility]  . Palpitation   . Sleep apnea    Dr. Elsworth Soho  . Tobacco abuse     Tobacco History: Social History   Tobacco Use  Smoking Status Current Every Day Smoker  . Packs/day: 1.00  . Years: 30.00  . Pack years: 30.00  . Types: Cigarettes  Smokeless Tobacco Never Used   Ready to quit: Not Answered Counseling given: Not Answered   No outpatient medications prior to visit.   No facility-administered  medications prior to visit.       Review of Systems  Review of Systems   Physical Exam  There were no vitals taken for this visit. Physical Exam   Lab Results:  CBC    Component Value Date/Time   WBC 3.8 (L) 11/28/2014 0756   RBC 5.07 11/28/2014 0756   HGB 16.0 (H) 11/28/2014 0756   HCT 47.8 (H) 11/28/2014 0756   PLT 220 11/28/2014 0756   MCV 94.3 11/28/2014 0756   MCH 31.6 11/28/2014 0756   MCHC 33.5 11/28/2014 0756   RDW 13.2 11/28/2014 0756   LYMPHSABS 1.7 06/27/2010 1544   MONOABS 0.5 06/27/2010 1544   EOSABS 0.1 06/27/2010 1544   BASOSABS 0.0 06/27/2010 1544    BMET    Component Value Date/Time   NA 142 11/28/2014 0756   K 4.5 11/28/2014 0756   CL 103 11/28/2014 0756   CO2 30 11/28/2014 0756   GLUCOSE 82 11/28/2014 0756   BUN 13 11/28/2014 0756   CREATININE 0.83 11/28/2014 0756   CALCIUM 9.9 11/28/2014 0756   GFRNONAA >60 02/22/2008 1829   GFRAA  02/22/2008 1829    >60        The eGFR has been calculated using the MDRD equation. This calculation has not been validated in all clinical    BNP No results found for: BNP  ProBNP No results found for: PROBNP  Imaging: No results found.   Assessment & Plan:   No problem-specific Assessment & Plan notes found for this encounter.     Martyn Ehrich, NP 05/09/2019

## 2020-05-23 ENCOUNTER — Encounter: Payer: Self-pay | Admitting: Family Medicine

## 2020-05-23 ENCOUNTER — Other Ambulatory Visit: Payer: Self-pay

## 2020-05-23 ENCOUNTER — Ambulatory Visit (INDEPENDENT_AMBULATORY_CARE_PROVIDER_SITE_OTHER): Payer: PRIVATE HEALTH INSURANCE | Admitting: Family Medicine

## 2020-05-23 VITALS — BP 125/83 | HR 82 | Temp 98.5°F | Ht 68.0 in | Wt 141.0 lb

## 2020-05-23 DIAGNOSIS — R059 Cough, unspecified: Secondary | ICD-10-CM

## 2020-05-23 DIAGNOSIS — R110 Nausea: Secondary | ICD-10-CM

## 2020-05-23 DIAGNOSIS — R8290 Unspecified abnormal findings in urine: Secondary | ICD-10-CM | POA: Diagnosis not present

## 2020-05-23 DIAGNOSIS — R859 Unspecified abnormal finding in specimens from digestive organs and abdominal cavity: Secondary | ICD-10-CM | POA: Diagnosis not present

## 2020-05-23 LAB — POCT URINALYSIS DIPSTICK
Blood, UA: NEGATIVE
Glucose, UA: POSITIVE — AB
Nitrite, UA: POSITIVE
Protein, UA: POSITIVE — AB
Spec Grav, UA: >=1.03 — AB (ref 1.010–1.025)
Urobilinogen, UA: 0.2 E.U./dL
pH, UA: 5.5 (ref 5.0–8.0)

## 2020-05-23 MED ORDER — CEPHALEXIN 500 MG PO CAPS: 500.0000 mg | ORAL_CAPSULE | Freq: Three times a day (TID) | ORAL | 0 refills | Status: DC

## 2020-05-23 MED ORDER — OMEPRAZOLE 40 MG PO CPDR: 40.0000 mg | DELAYED_RELEASE_CAPSULE | Freq: Every day | ORAL | 3 refills | Status: DC

## 2020-05-23 NOTE — Patient Instructions (Signed)
Nice to see you today.  Start omeprazole 1 tab before first meal daily.  Start keflex 1 tab every 8 hours for UTI Hydrate- flush kidneys- drink at least 80 ounces of water a day  Schedule physical in 4 weeks and we will complete ;labs that appt as well.

## 2020-05-23 NOTE — Progress Notes (Signed)
This visit occurred during the SARS-CoV-2 public health emergency.  Safety protocols were in place, including screening questions prior to the visit, additional usage of staff PPE, and extensive cleaning of exam room while observing appropriate contact time as indicated for disinfecting solutions.    Amber Zavala , 1966/04/07, 54 y.o., female MRN: [id number] Patient Care Team    Relationship Specialty Notifications Start End  Ma Hillock, DO PCP - General Family Medicine  03/23/18   Paula Compton, MD Consulting Physician Obstetrics and Gynecology  03/24/18   Wallene Huh, [place] Consulting Physician Podiatry  03/24/18   Rigoberto Noel, MD Consulting Physician Pulmonary Disease  03/24/18   Troy Sine, MD Consulting Physician Cardiology  03/24/18     Chief Complaint  Patient presents with  . Nausea    pt c/o nausea and mouth salvating x 6 mos; pt states that urine has an strong odor for 2 mos     Subjective: Pt presents for an OV with complaints of nausea, foul-smelling urine and increased saliva.  She denies drooling, fever, chills, vomit or bowel habit changes.  She denies burning with urination.  She has a mild cough that is chronic.  She is an everyday smoker.  She reports cough is works at night.  She denies heartburn.  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    Safety:      -smoke alarm in the home:Yes     - wears seatbelt: Yes     - Feels safe in their relationships: Yes   Past Medical History:  Diagnosis Date  . COPD (chronic obstructive pulmonary disease) (HCC)    Dr. Elsworth Soho  . Dysplastic nevus 2012   trunk x3  . Hot flashes   . Hyperplastic colon polyp 2015   [facility]- [facility]  . Palpitation   .  Periorbital cellulitis of right eye 03/30/2018  . Sleep apnea    Dr. Elsworth Soho  . Tobacco abuse    Past Surgical History:  Procedure Laterality Date  . APPENDECTOMY  1979  . BILATERAL TEMPOROMANDIBULAR JOINT ARTHROPLASTY    . BREAST ENHANCEMENT SURGERY    . CERVICAL BIOPSY  W/ LOOP ELECTRODE EXCISION  09/2001  . COLONOSCOPY W/ BIOPSIES  2015   hyperplastic polyp x1. [facility] ([facility])  . 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 RHINOPLASTY  2007  . TUBAL LIGATION  1997   Family History  Problem Relation Age of Onset  . Lung cancer Father   . Stroke Paternal Grandmother   . Lung cancer Paternal Grandfather   . Lupus Mother   . Arthritis Mother   . Colon cancer Neg Hx   . Esophageal cancer Neg Hx   . Rectal cancer Neg Hx   . Stomach cancer Neg Hx    Allergies as of 05/23/2020   No Known Allergies     Medication List       Accurate as of May 23, 2020 11:48 AM. If you have any questions, ask your nurse or doctor.        cephALEXin 500 MG capsule Commonly known as: KEFLEX Take 1 capsule (500 mg total) by mouth 3 (three) times daily. Started by: Howard Pouch,  DO   omeprazole 40 MG capsule Commonly known as: PRILOSEC Take 1 capsule (40 mg total) by mouth daily. Started by: Howard Pouch, DO       All past medical history, surgical history, allergies, family history, immunizations andmedications were updated in the EMR today and reviewed under the history and medication portions of their EMR.     ROS: Negative, with the exception of above mentioned in HPI   Objective:  BP 125/83   Pulse 82   Temp 98.5 F (36.9 C) (Oral)   Ht 5\' 8"  (1.727 m)   Wt 141 lb (64 kg)   SpO2 100%   BMI 21.44 kg/m  Body mass index is 21.44 kg/m. Gen: Afebrile. No acute distress. Nontoxic in appearance, well developed, well nourished.  HENT: AT. [place]. Bilateral TM visualized within normal limits. MMM, no oral lesions. Bilateral nares without swelling, erythema or drainage. Throat without  erythema or exudates.  Mild cough present.  Mild hoarseness present. Eyes:Pupils Equal Round Reactive to light, Extraocular movements intact,  Conjunctiva without redness, discharge or icterus. Neck/lymp/endocrine: Supple, no lymphadenopathy CV: RRR, no edema Chest: CTAB, no wheeze or crackles. Good air movement, normal resp effort.  Abd: Soft.NTND. BS present MSK: No CVA tenderness bilaterally Skin: No rashes, purpura or petechiae.  Neuro:  Normal gait. PERLA. EOMi. Alert. Oriented x3  Psych: Normal affect, dress and demeanor. Normal speech. Normal thought content and judgment.  No exam data present No results found. Results for orders placed or performed in visit on 05/23/20 (from the past 24 hour(s))  POCT Urinalysis Dipstick     Status: Abnormal   Collection Time: 05/23/20 11:12 AM  Result Value Ref Range   Color, UA amber    Clarity, UA cloudy    Glucose, UA Positive (A) Negative   Bilirubin, UA 2+    Ketones, UA 1+    Spec Grav, UA >=1.030 (A) 1.010 - 1.025   Blood, UA neg    pH, UA 5.5 5.0 - 8.0   Protein, UA Positive (A) Negative   Urobilinogen, UA 0.2 0.2 or 1.0 E.U./dL   Nitrite, UA pos    Leukocytes, UA Small (1+) (A) Negative   Appearance     Odor strong     Assessment/Plan: Amber Zavala is a 54 y.o. female present for OV for  Abnormal urine odor/Abnormal urine Urine appears infectious today. She also had glucose per urine dip. Will send for microanalysis and culture> start tx with keflex TID.  - POCT Urinalysis Dipstick - Urinalysis w microscopic + reflex cultur  Saliva abnormality/Cough/nausea Uncertain etiology of complaints- DDX: Related to UTI, GERD with light cough, allergies, salivary gland abnormality.  Start omeprazole daily Annual physical in 4 weeks with fasting labs and we will reevaluate current symptoms as well and consider other causes if symptoms still present.   Reviewed expectations re: course of current medical issues.  Discussed  self-management of symptoms.  Outlined signs and symptoms indicating need for more acute intervention.  Patient verbalized understanding and all questions were answered.  Patient received an After-Visit Summary.    Orders Placed This Encounter  Procedures  . Urinalysis w microscopic + reflex cultur  . POCT Urinalysis Dipstick   Meds ordered this encounter  Medications  . cephALEXin (KEFLEX) 500 MG capsule    Sig: Take 1 capsule (500 mg total) by mouth 3 (three) times daily.    Dispense:  21 capsule    Refill:  0  . omeprazole (PRILOSEC) 40 MG  capsule    Sig: Take 1 capsule (40 mg total) by mouth daily.    Dispense:  30 capsule    Refill:  3   Referral Orders  No referral(s) requested today     Note is dictated utilizing voice recognition software. Although note has been proof read prior to signing, occasional typographical errors still can be missed. If any questions arise, please do not hesitate to call for verification.   electronically signed by:  Howard Pouch, DO  [place]

## 2020-05-24 LAB — URINALYSIS W MICROSCOPIC + REFLEX CULTURE
Bilirubin Urine: NEGATIVE
Glucose, UA: NEGATIVE
Hgb urine dipstick: NEGATIVE
Hyaline Cast: NONE SEEN /LPF
Leukocyte Esterase: NEGATIVE
Nitrites, Initial: POSITIVE — AB
Protein, ur: NEGATIVE
RBC / HPF: NONE SEEN /HPF (ref 0–2)
Specific Gravity, Urine: 1.017 (ref 1.001–1.03)
WBC, UA: NONE SEEN /HPF (ref 0–5)
pH: 5.5 (ref 5.0–8.0)

## 2020-05-24 LAB — NO CULTURE INDICATED

## 2020-05-29 ENCOUNTER — Ambulatory Visit: Payer: 59 | Admitting: Family Medicine

## 2020-06-27 ENCOUNTER — Encounter: Payer: PRIVATE HEALTH INSURANCE | Admitting: Family Medicine

## 2020-08-09 ENCOUNTER — Other Ambulatory Visit: Payer: Self-pay

## 2020-08-09 ENCOUNTER — Encounter: Payer: Self-pay | Admitting: Family Medicine

## 2020-08-09 ENCOUNTER — Ambulatory Visit: Payer: 59 | Admitting: Family Medicine

## 2020-08-09 VITALS — BP 114/77 | HR 79 | Temp 98.4°F | Ht 67.25 in | Wt 141.0 lb

## 2020-08-09 DIAGNOSIS — Z130 Encounter for screening for diseases of the blood and blood-forming organs and certain disorders involving the immune mechanism: Secondary | ICD-10-CM | POA: Diagnosis not present

## 2020-08-09 DIAGNOSIS — Z720 Tobacco use: Secondary | ICD-10-CM

## 2020-08-09 DIAGNOSIS — Z13220 Encounter for screening for lipoid disorders: Secondary | ICD-10-CM | POA: Diagnosis not present

## 2020-08-09 DIAGNOSIS — Z23 Encounter for immunization: Secondary | ICD-10-CM

## 2020-08-09 DIAGNOSIS — K219 Gastro-esophageal reflux disease without esophagitis: Secondary | ICD-10-CM

## 2020-08-09 DIAGNOSIS — Z0000 Encounter for general adult medical examination without abnormal findings: Secondary | ICD-10-CM | POA: Diagnosis not present

## 2020-08-09 DIAGNOSIS — J449 Chronic obstructive pulmonary disease, unspecified: Secondary | ICD-10-CM | POA: Diagnosis not present

## 2020-08-09 DIAGNOSIS — R059 Cough, unspecified: Secondary | ICD-10-CM

## 2020-08-09 DIAGNOSIS — Z131 Encounter for screening for diabetes mellitus: Secondary | ICD-10-CM | POA: Diagnosis not present

## 2020-08-09 LAB — COMPREHENSIVE METABOLIC PANEL
ALT: 17 U/L (ref 0–35)
AST: 19 U/L (ref 0–37)
Albumin: 4.5 g/dL (ref 3.5–5.2)
Alkaline Phosphatase: 60 U/L (ref 39–117)
BUN: 14 mg/dL (ref 6–23)
CO2: 31 mEq/L (ref 19–32)
Calcium: 10 mg/dL (ref 8.4–10.5)
Chloride: 102 mEq/L (ref 96–112)
Creatinine, Ser: 0.83 mg/dL (ref 0.40–1.20)
GFR: 80.32 mL/min (ref >60.00)
Glucose, Bld: 86 mg/dL (ref 70–99)
Potassium: 4.5 mEq/L (ref 3.5–5.1)
Sodium: 139 mEq/L (ref 135–145)
Total Bilirubin: 0.8 mg/dL (ref 0.2–1.2)
Total Protein: 7.5 g/dL (ref 6.0–8.3)

## 2020-08-09 LAB — CBC WITH DIFFERENTIAL/PLATELET
Basophils Absolute: 0 10*3/uL (ref 0.0–0.1)
Basophils Relative: 0.5 % (ref 0.0–3.0)
Eosinophils Absolute: 0.1 10*3/uL (ref 0.0–0.7)
Eosinophils Relative: 2.9 % (ref 0.0–5.0)
HCT: 44.9 % (ref 36.0–46.0)
Hemoglobin: 15.3 g/dL — ABNORMAL HIGH (ref 12.0–15.0)
Lymphocytes Relative: 34.2 % (ref 12.0–46.0)
Lymphs Abs: 1.5 10*3/uL (ref 0.7–4.0)
MCHC: 34 g/dL (ref 30.0–36.0)
MCV: 95.1 fl (ref 78.0–100.0)
Monocytes Absolute: 0.5 10*3/uL (ref 0.1–1.0)
Monocytes Relative: 10.5 % (ref 3.0–12.0)
Neutro Abs: 2.3 10*3/uL (ref 1.4–7.7)
Neutrophils Relative %: 51.9 % (ref 43.0–77.0)
Platelets: 232 10*3/uL (ref 150.0–400.0)
RBC: 4.72 Mil/uL (ref 3.87–5.11)
RDW: 13.1 % (ref 11.5–15.5)
WBC: 4.4 10*3/uL (ref 4.0–10.5)

## 2020-08-09 LAB — LIPID PANEL
Cholesterol: 208 mg/dL — ABNORMAL HIGH (ref 0–200)
HDL: 83.7 mg/dL (ref >39.00)
LDL Cholesterol: 111 mg/dL — ABNORMAL HIGH (ref 0–99)
NonHDL: 124.57
Total CHOL/HDL Ratio: 2
Triglycerides: 66 mg/dL (ref 0.0–149.0)
VLDL: 13.2 mg/dL (ref 0.0–40.0)

## 2020-08-09 LAB — HEMOGLOBIN A1C: Hgb A1c MFr Bld: 5.5 % (ref 4.6–6.5)

## 2020-08-09 MED ORDER — OMEPRAZOLE 40 MG PO CPDR: 40.0000 mg | DELAYED_RELEASE_CAPSULE | Freq: Every day | ORAL | 3 refills | Status: DC

## 2020-08-09 MED ORDER — FAMOTIDINE 20 MG PO TABS: 20.0000 mg | ORAL_TABLET | Freq: Two times a day (BID) | ORAL | 3 refills | Status: DC

## 2020-08-09 NOTE — Patient Instructions (Signed)
Health Maintenance, Female Adopting a healthy lifestyle and getting preventive care are important in promoting health and wellness. Ask your health care provider about:  The right schedule for you to have regular tests and exams.  Things you can do on your own to prevent diseases and keep yourself healthy. What should I know about diet, weight, and exercise? Eat a healthy diet  Eat a diet that includes plenty of vegetables, fruits, low-fat dairy products, and lean protein.  Do not eat a lot of foods that are high in solid fats, added sugars, or sodium.   Maintain a healthy weight Body mass index (BMI) is used to identify weight problems. It estimates body fat based on height and weight. Your health care provider can help determine your BMI and help you achieve or maintain a healthy weight. Get regular exercise Get regular exercise. This is one of the most important things you can do for your health. Most adults should:  Exercise for at least 150 minutes each week. The exercise should increase your heart rate and make you sweat (moderate-intensity exercise).  Do strengthening exercises at least twice a week. This is in addition to the moderate-intensity exercise.  Spend less time sitting. Even light physical activity can be beneficial. Watch cholesterol and blood lipids Have your blood tested for lipids and cholesterol at 54 years of age, then have this test every 5 years. Have your cholesterol levels checked more often if:  Your lipid or cholesterol levels are high.  You are older than 54 years of age.  You are at high risk for heart disease. What should I know about cancer screening? Depending on your health history and family history, you may need to have cancer screening at various ages. This may include screening for:  Breast cancer.  Cervical cancer.  Colorectal cancer.  Skin cancer.  Lung cancer. What should I know about heart disease, diabetes, and high blood  pressure? Blood pressure and heart disease  High blood pressure causes heart disease and increases the risk of stroke. This is more likely to develop in people who have high blood pressure readings, are of African descent, or are overweight.  Have your blood pressure checked: ? Every 3-5 years if you are 18-39 years of age. ? Every year if you are 40 years old or older. Diabetes Have regular diabetes screenings. This checks your fasting blood sugar level. Have the screening done:  Once every three years after age 40 if you are at a normal weight and have a low risk for diabetes.  More often and at a younger age if you are overweight or have a high risk for diabetes. What should I know about preventing infection? Hepatitis B If you have a higher risk for hepatitis B, you should be screened for this virus. Talk with your health care provider to find out if you are at risk for hepatitis B infection. Hepatitis C Testing is recommended for:  Everyone born from 1945 through 1965.  Anyone with known risk factors for hepatitis C. Sexually transmitted infections (STIs)  Get screened for STIs, including gonorrhea and chlamydia, if: ? You are sexually active and are younger than 54 years of age. ? You are older than 54 years of age and your health care provider tells you that you are at risk for this type of infection. ? Your sexual activity has changed since you were last screened, and you are at increased risk for chlamydia or gonorrhea. Ask your health care provider   if you are at risk.  Ask your health care provider about whether you are at high risk for HIV. Your health care provider may recommend a prescription medicine to help prevent HIV infection. If you choose to take medicine to prevent HIV, you should first get tested for HIV. You should then be tested every 3 months for as long as you are taking the medicine. Pregnancy  If you are about to stop having your period (premenopausal) and  you may become pregnant, seek counseling before you get pregnant.  Take 400 to 800 micrograms (mcg) of folic acid every day if you become pregnant.  Ask for birth control (contraception) if you want to prevent pregnancy. Osteoporosis and menopause Osteoporosis is a disease in which the bones lose minerals and strength with aging. This can result in bone fractures. If you are 65 years old or older, or if you are at risk for osteoporosis and fractures, ask your health care provider if you should:  Be screened for bone loss.  Take a calcium or vitamin D supplement to lower your risk of fractures.  Be given hormone replacement therapy (HRT) to treat symptoms of menopause. Follow these instructions at home: Lifestyle  Do not use any products that contain nicotine or tobacco, such as cigarettes, e-cigarettes, and chewing tobacco. If you need help quitting, ask your health care provider.  Do not use street drugs.  Do not share needles.  Ask your health care provider for help if you need support or information about quitting drugs. Alcohol use  Do not drink alcohol if: ? Your health care provider tells you not to drink. ? You are pregnant, may be pregnant, or are planning to become pregnant.  If you drink alcohol: ? Limit how much you use to 0-1 drink a day. ? Limit intake if you are breastfeeding.  Be aware of how much alcohol is in your drink. In the [place] one drink equals one 12 oz bottle of beer (355 mL), one 5 oz glass of wine (148 mL), or one 1 oz glass of hard liquor (44 mL). General instructions  Schedule regular health, dental, and eye exams.  Stay current with your vaccines.  Tell your health care provider if: ? You often feel depressed. ? You have ever been abused or do not feel safe at home. Summary  Adopting a healthy lifestyle and getting preventive care are important in promoting health and wellness.  Follow your health care provider's instructions about healthy  diet, exercising, and getting tested or screened for diseases.  Follow your health care provider's instructions on monitoring your cholesterol and blood pressure. This information is not intended to replace advice given to you by your health care provider. Make sure you discuss any questions you have with your health care provider. Document Revised: 07/07/2018 Document Reviewed: 07/07/2018 Elsevier Patient Education  2021 Elsevier Inc.  

## 2020-08-09 NOTE — Progress Notes (Signed)
This visit occurred during the SARS-CoV-2 public health emergency.  Safety protocols were in place, including screening questions prior to the visit, additional usage of staff PPE, and extensive cleaning of exam room while observing appropriate contact time as indicated for disinfecting solutions.    Patient ID: Amber Zavala, female  DOB: [birth year], 55 y.o.   MRN: [id number] Patient Care Team    Relationship Specialty Notifications Start End  Ma Hillock, DO PCP - General Family Medicine  03/23/18   Paula Compton, MD Consulting Physician Obstetrics and Gynecology  03/24/18   Wallene Huh, [place] Consulting Physician Podiatry  03/24/18   Rigoberto Noel, MD Consulting Physician Pulmonary Disease  03/24/18   Troy Sine, MD Consulting Physician Cardiology  03/24/18     Chief Complaint  Patient presents with  . Annual Exam    Pt is fasting    Subjective: Amber Zavala is a 55 y.o. female present for CPE. All past medical history, surgical history, allergies, family history, immunizations, medications and social history were updated in the electronic medical record today. All recent labs, ED visits and hospitalizations within the last year were reviewed.  Health maintenance:  Colonoscopy: last screen 2015, due 2025- hyperplastic polyp only. Immunizations:  tdap declined, influenza UTD 2021, PNA 23 provided today shingrix #1 today (nurse visit 3 mos for #2) Mam/PAP: Completed through GYN- has appt this month.  Infectious disease screening: HIV and Hep C declined Assistive device: none Oxygen OEV:OJJK Patient has a Dental home. Hospitalizations/ED visits: reviewed  Depression screen [facility] 2/9 05/23/2020 03/23/2018  Decreased Interest 0 0  Down, Depressed, Hopeless 0 0  PHQ - 2 Score 0 0   No flowsheet data found.      Fall Risk  03/23/2018  Falls in the past year? No    Immunization History  Administered Date(s) Administered  . Influenza-Unspecified 03/26/2020  .  PFIZER SARS-COV-2 Vaccination 02/09/2020, 03/06/2020  . Td 09/18/2009    Past Medical History:  Diagnosis Date  . COPD (chronic obstructive pulmonary disease) (HCC)    Dr. Elsworth Soho  . Dysplastic nevus 2012   trunk x3  . Hot flashes   . Hyperplastic colon polyp 2015   [facility]- [facility]  . Palpitation   . Periorbital cellulitis of right eye 03/30/2018  . Sleep apnea    Dr. Elsworth Soho  . Tobacco abuse    No Known Allergies Past Surgical History:  Procedure Laterality Date  . APPENDECTOMY  1979  . BILATERAL TEMPOROMANDIBULAR JOINT ARTHROPLASTY    . BREAST ENHANCEMENT SURGERY    . CERVICAL BIOPSY  W/ LOOP ELECTRODE EXCISION  09/2001  . COLONOSCOPY W/ BIOPSIES  2015   hyperplastic polyp x1. [facility] ([facility])  . 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 RHINOPLASTY  2007  . TUBAL LIGATION  1997   Family History  Problem Relation Age of Onset  . Lung cancer Father   . Stroke Paternal Grandmother   . Lung cancer Paternal Grandfather   . Lupus Mother   . Arthritis Mother   . Colon cancer Neg Hx   . Esophageal cancer Neg Hx   . Rectal cancer Neg Hx   . 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    Safety:      -smoke alarm in the home:Yes     - wears seatbelt: Yes     -  Feels safe in their relationships: Yes    Allergies as of 08/09/2020   No Known Allergies     Medication List       Accurate as of August 09, 2020  9:05 AM. If you have any questions, ask your nurse or doctor.        STOP taking these medications   cephALEXin 500 MG capsule Commonly known as: KEFLEX Stopped by: Howard Pouch, DO   cloNIDine 0.1 MG tablet Commonly known as: CATAPRES Stopped by: Howard Pouch, DO   fluconazole 150 MG tablet Commonly known as: DIFLUCAN Stopped by: Howard Pouch, DO   metroNIDAZOLE 500 MG tablet Commonly known as: FLAGYL Stopped by: Howard Pouch,  DO     TAKE these medications   famotidine 20 MG tablet Commonly known as: Pepcid Take 1 tablet (20 mg total) by mouth 2 (two) times daily. Started by: Howard Pouch, DO   omeprazole 40 MG capsule Commonly known as: PRILOSEC Take 1 capsule (40 mg total) by mouth daily.      All past medical history, surgical history, allergies, family history, immunizations andmedications were updated in the EMR today and reviewed under the history and medication portions of their EMR.      No results found.  ROS: 14 pt review of systems performed and negative (unless mentioned in an HPI)  Objective: BP 114/77   Pulse 79   Temp 98.4 F (36.9 C) (Oral)   Ht 5' 7.25" (1.708 m)   Wt 141 lb (64 kg)   SpO2 100%   BMI 21.92 kg/m  Gen: Afebrile. No acute distress. Nontoxic in appearance, well-developed, well-nourished,  Pleasant female.  HENT: AT. [place]. Bilateral TM visualized and normal in appearance, normal external auditory canal. MMM, no oral lesions, adequate dentition. Bilateral nares within normal limits. Throat without erythema, ulcerations or exudates. no Cough on exam, no hoarseness on exam. Eyes:Pupils Equal Round Reactive to light, Extraocular movements intact,  Conjunctiva without redness, discharge or icterus. Neck/lymp/endocrine: Supple,no lymphadenopathy, no thyromegaly CV: RRR no murmur, no edema, +2/4 P posterior tibialis pulses.  Chest: CTAB, no wheeze, rhonchi or crackles. normal Respiratory effort. good Air movement. Abd: Soft. flat. NTND. BS present. no Masses palpated. No hepatosplenomegaly. No rebound tenderness or guarding. Skin: no rashes, purpura or petechiae. Warm and well-perfused. Skin intact. Neuro/Msk:  Normal gait. PERLA. EOMi. Alert. Oriented x3.  Cranial nerves II through XII intact. Muscle strength 5/5 upper/lower extremity. DTRs equal bilaterally. Psych: Normal affect, dress and demeanor. Normal speech. Normal thought content and judgment.  No exam data  present  Assessment/plan: Amber Zavala is a 55 y.o. female present for CPE Diabetes mellitus screening - Comprehensive metabolic panel - Hemoglobin A1c Lipid screening - Lipid panel Screening for deficiency anemia - CBC with Differential/Platelet  Need for shingles vaccine shingrix #1 provided today- nurse visit 3 mos for completion of series.   Need for pneumococcal vaccination/tobaco use/COPD - smoker- pneumovax 23 provided today  Gastroesophageal reflux disease without esophagitis/Cough Improved, but not resolved.  Smoking cessation encouraged.  Continue omeprazole 40 qd Start pepcid BID.  If symptoms still remain after 1 month can refer to GI  Routine general medical examination at a health care facility Patient was encouraged to exercise greater than 150 minutes a week. Patient was encouraged to choose a diet filled with fresh fruits and vegetables, and lean meats. AVS provided to patient today for education/recommendation on gender specific health and safety maintenance. Colonoscopy: last screen 2015, due 2025- hyperplastic polyp only. Immunizations:  tdap declined, influenza UTD 2021, PNA 23 provided today shingrix #1 today (nurse visit 3 mos for #2) Mam/PAP: Completed through GYN- has appt this month.  Infectious disease screening: HIV and Hep C declined  Return in about 1 year (around 08/09/2021) for CPE (30 min).  Orders Placed This Encounter  Procedures  . CBC with Differential/Platelet  . Comprehensive metabolic panel  . Hemoglobin A1c  . Lipid panel   Meds ordered this encounter  Medications  . omeprazole (PRILOSEC) 40 MG capsule    Sig: Take 1 capsule (40 mg total) by mouth daily.    Dispense:  90 capsule    Refill:  3  . famotidine (PEPCID) 20 MG tablet    Sig: Take 1 tablet (20 mg total) by mouth 2 (two) times daily.    Dispense:  120 tablet    Refill:  3   Referral Orders  No referral(s) requested today    Note is dictated utilizing voice  recognition software. Although note has been proof read prior to signing, occasional typographical errors still can be missed. If any questions arise, please do not hesitate to call for verification.  Electronically signed by: Howard Pouch, DO [place]

## 2020-08-21 DIAGNOSIS — Z01419 Encounter for gynecological examination (general) (routine) without abnormal findings: Secondary | ICD-10-CM | POA: Diagnosis not present

## 2020-08-21 DIAGNOSIS — N951 Menopausal and female climacteric states: Secondary | ICD-10-CM | POA: Diagnosis not present

## 2020-08-21 DIAGNOSIS — Z6821 Body mass index (BMI) 21.0-21.9, adult: Secondary | ICD-10-CM | POA: Diagnosis not present

## 2020-08-21 DIAGNOSIS — Z130 Encounter for screening for diseases of the blood and blood-forming organs and certain disorders involving the immune mechanism: Secondary | ICD-10-CM | POA: Diagnosis not present

## 2020-08-21 DIAGNOSIS — Z1231 Encounter for screening mammogram for malignant neoplasm of breast: Secondary | ICD-10-CM | POA: Diagnosis not present

## 2020-08-21 LAB — HM MAMMOGRAPHY

## 2020-08-27 ENCOUNTER — Telehealth: Payer: Self-pay

## 2020-08-27 DIAGNOSIS — K219 Gastro-esophageal reflux disease without esophagitis: Secondary | ICD-10-CM

## 2020-08-27 NOTE — Telephone Encounter (Signed)
Referral placed.

## 2020-08-27 NOTE — Telephone Encounter (Signed)
Patient would like Dr. Raoul Pitch to start referral steps for GI doctor.  She stated Dr. Raoul Pitch is aware of GI issues. Patient can be reached at [phone]

## 2020-08-28 ENCOUNTER — Encounter: Payer: Self-pay | Admitting: Gastroenterology

## 2020-09-13 ENCOUNTER — Ambulatory Visit (INDEPENDENT_AMBULATORY_CARE_PROVIDER_SITE_OTHER): Payer: 59 | Admitting: Gastroenterology

## 2020-09-13 ENCOUNTER — Encounter: Payer: Self-pay | Admitting: Gastroenterology

## 2020-09-13 VITALS — BP 128/78 | HR 77 | Ht 68.0 in | Wt 145.0 lb

## 2020-09-13 DIAGNOSIS — K219 Gastro-esophageal reflux disease without esophagitis: Secondary | ICD-10-CM | POA: Diagnosis not present

## 2020-09-13 DIAGNOSIS — R110 Nausea: Secondary | ICD-10-CM | POA: Diagnosis not present

## 2020-09-13 DIAGNOSIS — K117 Disturbances of salivary secretion: Secondary | ICD-10-CM | POA: Diagnosis not present

## 2020-09-13 DIAGNOSIS — R6889 Other general symptoms and signs: Secondary | ICD-10-CM | POA: Diagnosis not present

## 2020-09-13 DIAGNOSIS — R0989 Other specified symptoms and signs involving the circulatory and respiratory systems: Secondary | ICD-10-CM

## 2020-09-13 NOTE — Patient Instructions (Signed)
If you are age 54 or older, your body mass index should be between 23-30. Your Body mass index is 22.05 kg/m. If this is out of the aforementioned range listed, please consider follow up with your Primary Care Provider.  If you are age 29 or younger, your body mass index should be between 19-25. Your Body mass index is 22.05 kg/m. If this is out of the aformentioned range listed, please consider follow up with your Primary Care Provider.   Stop Pepcid  You have been scheduled for an endoscopy. Please follow written instructions given to you at your visit today. If you use inhalers (even only as needed), please bring them with you on the day of your procedure.  It was a pleasure to see you today!  Vito Cirigliano, D.O.

## 2020-09-13 NOTE — Progress Notes (Signed)
Chief Complaint: Nausea, waterbrash  Referring Provider:     Ma Hillock, DO   HPI:     Amber Zavala is a 55 y.o. female with a history of COPD, sleep apnea, tobacco use disorder, referred to the Gastroenterology Clinic for evaluation of suspected reflux symptoms.  Sxs started in July 2021 with nausea without vomiting, hypersalivation, increased throat clearing. Started Omeprazole 40 mg daily with some improvement, but not resolved. Was recently started on Pepcid 20 mg bid last month with no appreciable improvement. Sxs tend to improve with eating. No nocturnal sxs.   No dysphagia, odynophagia, GI blood loss, abdominal pain, f/c/d/c.  No prior EGD.  Endoscopic History: -Colonoscopy (2015): Hyperplastic polyp.  Repeat 10 years  No known family history of CRC, GI malignancy, liver disease, pancreatic disease, or IBD.    CBC    Component Value Date/Time   WBC 4.4 08/09/2020 0923   RBC 4.72 08/09/2020 0923   HGB 15.3 (H) 08/09/2020 0923   HCT 44.9 08/09/2020 0923   PLT 232.0 08/09/2020 0923   MCV 95.1 08/09/2020 0923   MCH 31.6 11/28/2014 0756   MCHC 34.0 08/09/2020 0923   RDW 13.1 08/09/2020 0923   LYMPHSABS 1.5 08/09/2020 0923   MONOABS 0.5 08/09/2020 0923   EOSABS 0.1 08/09/2020 0923   BASOSABS 0.0 08/09/2020 0923    CMP Latest Ref Rng & Units 08/09/2020 11/28/2014 02/22/2008  Glucose 70 - 99 mg/dL 86 82 85  BUN 6 - 23 mg/dL 14 13 17   Creatinine 0.40 - 1.20 mg/dL 0.83 0.83 0.71  Sodium 135 - 145 mEq/L 139 142 141  Potassium 3.5 - 5.1 mEq/L 4.5 4.5 4.1  Chloride 96 - 112 mEq/L 102 103 104  CO2 19 - 32 mEq/L 31 30 31   Calcium 8.4 - 10.5 mg/dL 10.0 9.9 9.7  Total Protein 6.0 - 8.3 g/dL 7.5 7.6 -  Total Bilirubin 0.2 - 1.2 mg/dL 0.8 0.8 -  Alkaline Phos 39 - 117 U/L 60 48 -  AST 0 - 37 U/L 19 17 -  ALT 0 - 35 U/L 17 15 -     Past Medical History:  Diagnosis Date  . COPD (chronic obstructive pulmonary disease) (HCC)    Dr. Elsworth Soho  . Dysplastic  nevus 2012   trunk x3  . GERD (gastroesophageal reflux disease)   . Hot flashes   . Hyperplastic colon polyp 2015   [facility]- [facility]  . Palpitation   . Periorbital cellulitis of right eye 03/30/2018  . Sleep apnea    Dr. Elsworth Soho  . Tobacco abuse      Past Surgical History:  Procedure Laterality Date  . APPENDECTOMY  1979  . BILATERAL TEMPOROMANDIBULAR JOINT ARTHROPLASTY    . BREAST ENHANCEMENT SURGERY    . CERVICAL BIOPSY  W/ LOOP ELECTRODE EXCISION  09/2001  . COLONOSCOPY W/ BIOPSIES  2015   hyperplastic polyp x1. [facility] ([facility])  . 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 RHINOPLASTY  2007  . TUBAL LIGATION  1997   Family History  Problem Relation Age of Onset  . Lung cancer Father   . Stroke Paternal Grandmother   . Lung cancer Paternal Grandfather   . Lupus Mother   . Arthritis Mother   . Colon cancer Neg Hx   . Esophageal cancer Neg Hx   . Rectal cancer Neg Hx   . Stomach cancer Neg Hx  Social History   Tobacco Use  . Smoking status: Current Every Day Smoker    Packs/day: 1.00    Years: 30.00    Pack years: 30.00    Types: Cigarettes  . Smokeless tobacco: Never Used  Vaping Use  . Vaping Use: Never used  Substance Use Topics  . Alcohol use: Yes    Alcohol/week: 0.0 standard drinks    Comment: 4 times a week  . Drug use: No   Current Outpatient Medications  Medication Sig Dispense Refill  . famotidine (PEPCID) 20 MG tablet Take 1 tablet (20 mg total) by mouth 2 (two) times daily. 120 tablet 3  . omeprazole (PRILOSEC) 40 MG capsule Take 1 capsule (40 mg total) by mouth daily. 90 capsule 3   No current facility-administered medications for this visit.   No Known Allergies   Review of Systems: All systems reviewed and negative except where noted in HPI.     Physical Exam:    Wt Readings from Last 3 Encounters:  09/13/20 145 lb (65.8 kg)  08/09/20 141 lb (64 kg)  05/23/20 141 lb (64 kg)    BP 128/78   Pulse 77   Ht 5\' 8"  (1.727 m)   Wt 145  lb (65.8 kg)   BMI 22.05 kg/m  Constitutional:  Pleasant, in no acute distress. Psychiatric: Normal mood and affect. Behavior is normal. EENT: Pupils normal.  Conjunctivae are normal. No scleral icterus. Neck supple. No cervical LAD. Cardiovascular: Normal rate, regular rhythm. No edema Pulmonary/chest: Effort normal and breath sounds normal. No wheezing, rales or rhonchi. Abdominal: Soft, nondistended, nontender. Bowel sounds active throughout. There are no masses palpable. No hepatomegaly. Neurological: Alert and oriented to person place and time. Skin: Skin is warm and dry. No rashes noted.   ASSESSMENT AND PLAN;   1) Nausea without vomiting 2) Hypersalivation 3) Increased throat clearing  - EGD to evaluate for erosive esophagitis, LES laxity, hiatal hernia, along with evaluation for gastritis, PUD, etc. with gastric biopsies - Ok to stop Pepcid given no improvement - Resume Prilosec   The indications, risks, and benefits of EGD were explained to the patient in detail. Risks include but are not limited to bleeding, perforation, adverse reaction to medications, and cardiopulmonary compromise. Sequelae include but are not limited to the possibility of surgery, hospitalization, and mortality. The patient verbalized understanding and wished to proceed. All questions answered, referred to scheduler. Further recommendations pending results of the exam.      Lavena Bullion, DO, FACG  09/13/2020, 9:33 AM   Kuneff, Renee A, DO

## 2020-10-05 ENCOUNTER — Encounter: Payer: Self-pay | Admitting: Gastroenterology

## 2020-10-18 ENCOUNTER — Encounter: Payer: Self-pay | Admitting: Certified Registered Nurse Anesthetist

## 2020-10-19 ENCOUNTER — Encounter: Payer: BC Managed Care – PPO | Admitting: Gastroenterology

## 2020-10-19 ENCOUNTER — Encounter: Payer: Self-pay | Admitting: Gastroenterology

## 2020-10-19 ENCOUNTER — Other Ambulatory Visit: Payer: Self-pay

## 2020-10-19 ENCOUNTER — Ambulatory Visit (AMBULATORY_SURGERY_CENTER): Payer: 59 | Admitting: Gastroenterology

## 2020-10-19 VITALS — BP 136/92 | HR 74 | Temp 97.1°F | Resp 22 | Ht 68.0 in | Wt 145.0 lb

## 2020-10-19 DIAGNOSIS — R0989 Other specified symptoms and signs involving the circulatory and respiratory systems: Secondary | ICD-10-CM

## 2020-10-19 DIAGNOSIS — K2970 Gastritis, unspecified, without bleeding: Secondary | ICD-10-CM | POA: Diagnosis not present

## 2020-10-19 DIAGNOSIS — R110 Nausea: Secondary | ICD-10-CM | POA: Diagnosis not present

## 2020-10-19 DIAGNOSIS — K3189 Other diseases of stomach and duodenum: Secondary | ICD-10-CM | POA: Diagnosis not present

## 2020-10-19 DIAGNOSIS — K117 Disturbances of salivary secretion: Secondary | ICD-10-CM

## 2020-10-19 DIAGNOSIS — K219 Gastro-esophageal reflux disease without esophagitis: Secondary | ICD-10-CM | POA: Diagnosis not present

## 2020-10-19 DIAGNOSIS — R6889 Other general symptoms and signs: Secondary | ICD-10-CM

## 2020-10-19 MED ORDER — SODIUM CHLORIDE 0.9 % IV SOLN: 500.0000 mL | Freq: Once | INTRAVENOUS | Status: DC

## 2020-10-19 NOTE — Progress Notes (Signed)
VS by CW. ?

## 2020-10-19 NOTE — Progress Notes (Signed)
0945 Robinul 0.1 mg IV given due large amount of secretions upon assessment.  MD made aware, vss 

## 2020-10-19 NOTE — Op Note (Signed)
[facility] Patient Name: Amber Zavala Procedure Date: 10/19/2020 9:49 AM MRN: [id number] Endoscopist: Gerrit Heck , MD Age: 55 Referring MD:  Date of Birth: 10-26-1965 Gender: Female Account #: [id number] Procedure:                Upper GI endoscopy Indications:              Suspected esophageal reflux, Nausea without emesis,                            Waterbrash, hypersalivation, increased throat                            clearing Medicines:                Monitored Anesthesia Care Procedure:                Pre-Anesthesia Assessment:                           - Prior to the procedure, a History and Physical                            was performed, and patient medications and                            allergies were reviewed. The patient's tolerance of                            previous anesthesia was also reviewed. The risks                            and benefits of the procedure and the sedation                            options and risks were discussed with the patient.                            All questions were answered, and informed consent                            was obtained. Prior Anticoagulants: The patient has                            taken no previous anticoagulant or antiplatelet                            agents. ASA Grade Assessment: III - A patient with                            severe systemic disease. After reviewing the risks                            and benefits, the patient was deemed in  satisfactory condition to undergo the procedure.                           After obtaining informed consent, the endoscope was                            passed under direct vision. Throughout the                            procedure, the patient's blood pressure, pulse, and                            oxygen saturations were monitored continuously. The                            Endoscope was introduced through the mouth,  and                            advanced to the second part of duodenum. The upper                            GI endoscopy was accomplished without difficulty.                            The patient tolerated the procedure well. Scope In: Scope Out: Findings:                 The upper third of the esophagus, middle third of                            the esophagus and lower third of the esophagus were                            normal.                           There was a single small island of salmon colored                            mucosa located immediately proximal to an otherwise                            normal appearing Z-line at 43 cm from the incisors.                            Biopsies were taken with a cold forceps for                            histology. Estimated blood loss was minimal.                           The entire examined stomach was normal. Biopsies  were taken with a cold forceps for Helicobacter                            pylori testing. Estimated blood loss was minimal.                            No LES laxity noted on anterograde or retroflexed                            views.                           The examined duodenum was normal. Biopsies for                            histology were taken with a cold forceps for                            evaluation of celiac disease. Estimated blood loss                            was minimal. Complications:            No immediate complications. Estimated Blood Loss:     Estimated blood loss was minimal. Impression:               - Normal upper third of esophagus, middle third of                            esophagus and lower third of esophagus.                           - Z-line regular, 43 cm from the incisors. Biopsied.                           - Normal stomach. Biopsied.                           - Normal examined duodenum. Biopsied. Recommendation:           - Patient has a  contact number available for                            emergencies. The signs and symptoms of potential                            delayed complications were discussed with the                            patient. Return to normal activities tomorrow.                            Written discharge instructions were provided to the                            patient.                           -  Resume previous diet.                           - Continue present medications.                           - Await pathology results.                           - Follow-up in the GI Clinic PRN. Gerrit Heck, MD 10/19/2020 10:11:48 AM

## 2020-10-19 NOTE — Progress Notes (Signed)
Report given to PACU, vss 

## 2020-10-19 NOTE — Progress Notes (Signed)
Called to room to assist during endoscopic procedure.  Patient ID and intended procedure confirmed with present staff. Received instructions for my participation in the procedure from the performing physician.  

## 2020-10-19 NOTE — Patient Instructions (Signed)
YOU HAD AN ENDOSCOPIC PROCEDURE TODAY AT THE [facility]:   Refer to the procedure report that was given to you for any specific questions about what was found during the examination.  If the procedure report does not answer your questions, please call your gastroenterologist to clarify.  If you requested that your care partner not be given the details of your procedure findings, then the procedure report has been included in a sealed envelope for you to review at your convenience later.  YOU SHOULD EXPECT: Some feelings of bloating in the abdomen. Passage of more gas than usual.  Walking can help get rid of the air that was put into your GI tract during the procedure and reduce the bloating. If you had a lower endoscopy (such as a colonoscopy or flexible sigmoidoscopy) you may notice spotting of blood in your stool or on the toilet paper. If you underwent a bowel prep for your procedure, you may not have a normal bowel movement for a few days.  Please Note:  You might notice some irritation and congestion in your nose or some drainage.  This is from the oxygen used during your procedure.  There is no need for concern and it should clear up in a day or so.  SYMPTOMS TO REPORT IMMEDIATELY:    Following upper endoscopy (EGD)  Vomiting of blood or coffee ground material  New chest pain or pain under the shoulder blades  Painful or persistently difficult swallowing  New shortness of breath  Fever of 100F or higher  Black, tarry-looking stools  For urgent or emergent issues, a gastroenterologist can be reached at any hour by calling [phone]. Do not use MyChart messaging for urgent concerns.    DIET:  We do recommend a small meal at first, but then you may proceed to your regular diet.  Drink plenty of fluids but you should avoid alcoholic beverages for 24 hours.  ACTIVITY:  You should plan to take it easy for the rest of today and you should NOT DRIVE or use heavy machinery  until tomorrow (because of the sedation medicines used during the test).    FOLLOW UP: Our staff will call the number listed on your records 48-72 hours following your procedure to check on you and address any questions or concerns that you may have regarding the information given to you following your procedure. If we do not reach you, we will leave a message.  We will attempt to reach you two times.  During this call, we will ask if you have developed any symptoms of COVID 19. If you develop any symptoms (ie: fever, flu-like symptoms, shortness of breath, cough etc.) before then, please call [phone].  If you test positive for Covid 19 in the 2 weeks post procedure, please call and report this information to us.    If any biopsies were taken you will be contacted by phone or by letter within the next 1-3 weeks.  Please call us at [phone] if you have not heard about the biopsies in 3 weeks.    SIGNATURES/CONFIDENTIALITY: You and/or your care partner have signed paperwork which will be entered into your electronic medical record.  These signatures attest to the fact that that the information above on your After Visit Summary has been reviewed and is understood.  Full responsibility of the confidentiality of this discharge information lies with you and/or your care-partner. 

## 2020-10-23 ENCOUNTER — Telehealth: Payer: Self-pay | Admitting: *Deleted

## 2020-10-23 NOTE — Telephone Encounter (Signed)
  Follow up Call-  Call back number 10/19/2020  Post procedure Call Back phone  # [phone]  Permission to leave phone message Yes  Some recent data might be hidden     Patient questions:  Do you have a fever, pain , or abdominal swelling? No. Pain Score  0 *  Have you tolerated food without any problems? Yes.    Have you been able to return to your normal activities? Yes.    Do you have any questions about your discharge instructions: Diet   No. Medications  No. Follow up visit  No.  Do you have questions or concerns about your Care? No.  Actions: * If pain score is 4 or above: 1. No action needed, pain <4.Have you developed a fever since your procedure? no  2.   Have you had an respiratory symptoms (SOB or cough) since your procedure? no  3.   Have you tested positive for COVID 19 since your procedure no  4.   Have you had any family members/close contacts diagnosed with the COVID 19 since your procedure?  no   If yes to any of these questions please route to Joylene John, RN and Joella Prince, RN

## 2020-10-29 ENCOUNTER — Ambulatory Visit: Payer: BC Managed Care – PPO

## 2020-10-30 ENCOUNTER — Ambulatory Visit: Payer: BC Managed Care – PPO

## 2020-10-30 ENCOUNTER — Encounter: Payer: Self-pay | Admitting: Gastroenterology

## 2020-11-16 ENCOUNTER — Encounter: Payer: Self-pay | Admitting: Gastroenterology

## 2020-11-16 ENCOUNTER — Ambulatory Visit (INDEPENDENT_AMBULATORY_CARE_PROVIDER_SITE_OTHER): Payer: 59 | Admitting: Gastroenterology

## 2020-11-16 ENCOUNTER — Other Ambulatory Visit: Payer: Self-pay

## 2020-11-16 VITALS — BP 130/82 | HR 73 | Ht 68.0 in | Wt 148.0 lb

## 2020-11-16 DIAGNOSIS — K117 Disturbances of salivary secretion: Secondary | ICD-10-CM | POA: Diagnosis not present

## 2020-11-16 DIAGNOSIS — R110 Nausea: Secondary | ICD-10-CM | POA: Diagnosis not present

## 2020-11-16 DIAGNOSIS — R6889 Other general symptoms and signs: Secondary | ICD-10-CM

## 2020-11-16 DIAGNOSIS — R0989 Other specified symptoms and signs involving the circulatory and respiratory systems: Secondary | ICD-10-CM

## 2020-11-16 MED ORDER — PANTOPRAZOLE SODIUM 40 MG PO TBEC: DELAYED_RELEASE_TABLET | ORAL | 0 refills | Status: DC

## 2020-11-16 NOTE — Patient Instructions (Signed)
If you are age 54 or older, your body mass index should be between 23-30. Your Body mass index is 22.5 kg/m. If this is out of the aforementioned range listed, please consider follow up with your Primary Care Provider.  If you are age 92 or younger, your body mass index should be between 19-25. Your Body mass index is 22.5 kg/m. If this is out of the aformentioned range listed, please consider follow up with your Primary Care Provider.   We have sent the following medications to your pharmacy for you to pick up at your convenience:  protonix 40mg   Due to recent changes in healthcare laws, you may see the results of your imaging and laboratory studies on MyChart before your provider has had a chance to review them.  We understand that in some cases there may be results that are confusing or concerning to you. Not all laboratory results come back in the same time frame and the provider may be waiting for multiple results in order to interpret others.  Please give [place] 48 hours in order for your provider to thoroughly review all the results before contacting the office for clarification of your results.   Thank you for choosing me and [facility].  Vito Cirigliano, D.O.

## 2020-11-16 NOTE — Progress Notes (Signed)
P  Chief Complaint: Hypersalivation, procedure follow-up  GI History: 55 y.o. female with a history of COPD, sleep apnea, tobacco use disorder, initially seen in GI clinic on 09/13/2020 for evaluation of reflux symptoms.  Sxs started in July 2021 with nausea without vomiting, hypersalivation, increased throat clearing. Started Omeprazole 40 mg daily with some improvement, but not resolved.   Started Pepcid 20 mg bid in 07/2020 with no appreciable improvement. Sxs tend to improve with eating. No nocturnal sxs.   No dysphagia, odynophagia, GI blood loss, abdominal pain, f/c/d/c.  No prior EGD.  Endoscopic History: -Colonoscopy (2015): Hyperplastic polyp.  Repeat 10 years -EGD (09/2020): Single small island of salmon-colored mucosa immediately proximal to the Z-line (path: Benign, no intestinal metaplasia), Otherwise normal esophagus, normal stomach and duodenum with benign path   HPI:     Patient is a 55 y.o. female presenting to the Gastroenterology Clinic for follow-up.  Initially seen by me on 09/13/2020 for evaluation of nausea without vomiting, hypersalivation, and increased throat clearing.  Had stopped Pepcid due to no appreciable improvement and resume Prilosec 40 mg/day.  EGD completed 09/2020 as outlined above.  Today, she states she continues to have hypersalivation.  No appreciable improvement despite H2 RA and PPI as above.  No other complaints today.  Review of systems:     No chest pain, no SOB, no fevers, no urinary sx   Past Medical History:  Diagnosis Date  . COPD (chronic obstructive pulmonary disease) (HCC)    Dr. Elsworth Soho  . Dysplastic nevus 2012   trunk x3  . GERD (gastroesophageal reflux disease)   . Hot flashes   . Hyperplastic colon polyp 2015   [facility]  . Palpitation   . Periorbital cellulitis of right eye 03/30/2018  . Sleep apnea    Dr. Elsworth Soho  . Tobacco abuse     Patient's surgical history, family medical history, social history,  medications and allergies were all reviewed in Epic    Current Outpatient Medications  Medication Sig Dispense Refill  . omeprazole (PRILOSEC) 40 MG capsule Take 1 capsule (40 mg total) by mouth daily. 90 capsule 3  . pantoprazole (PROTONIX) 40 MG tablet Take 1 tablet (40 mg total) by mouth 2 (two) times daily for 30 days, THEN 1 tablet (40 mg total) daily. 90 tablet 0   Current Facility-Administered Medications  Medication Dose Route Frequency Provider Last Rate Last Admin  . 0.9 %  sodium chloride infusion  500 mL Intravenous Once Jaydn Moscato V, DO        Physical Exam:     BP 130/82   Pulse 73   Ht 5\' 8"  (1.727 m)   Wt 148 lb (67.1 kg)   BMI 22.50 kg/m   GENERAL:  Pleasant female in NAD PSYCH: : Cooperative, normal affect NEURO: Alert and oriented x 3, no focal neurologic deficits   IMPRESSION and PLAN:    1) Hypersalivation 2) Increased throat clearing  Discussed DDx for increased throat clearing hypersalivation today.  From a GI standpoint, most common etiology would be reflux.  She is otherwise without typical reflux symptoms (i.e. heartburn, regurgitation) and no appreciable response to H2 RA and PPI so far.  Plan as follows:  - Trial course of high-dose Protonix 40 mg BID x4 weeks.  If no improvement, reduce to 40 mg/day and titrate off - Discussed referral for esophageal manometry and pH/impedance testing.  However, given lack of typical reflux symptoms, she is compliant to proceed with  agnostic study, which is reasonable.  However, if good improvement with trial of high-dose PPI above, may reconsider - Recent EGD otherwise unrevealing - If no improvement, consider referral to ENT? - Has appt with Dentist next week. Will ask about hypersalivation along with any e/o enamel erosion  I spent 25 minutes of time, including independent review of results as outlined above, communicating results with the patient directly, face-to-face time with the patient, coordinating  care, ordering studies and medications as appropriate, and documentation.          Lavena Bullion ,DO, FACG 11/16/2020, 4:37 PM

## 2020-12-07 ENCOUNTER — Encounter: Payer: BC Managed Care – PPO | Admitting: Gastroenterology

## 2020-12-18 ENCOUNTER — Other Ambulatory Visit: Payer: Self-pay | Admitting: Gastroenterology

## 2020-12-18 ENCOUNTER — Telehealth (INDEPENDENT_AMBULATORY_CARE_PROVIDER_SITE_OTHER): Payer: 59 | Admitting: Family Medicine

## 2020-12-18 ENCOUNTER — Encounter: Payer: Self-pay | Admitting: Family Medicine

## 2020-12-18 DIAGNOSIS — R8290 Unspecified abnormal findings in urine: Secondary | ICD-10-CM

## 2020-12-18 DIAGNOSIS — E860 Dehydration: Secondary | ICD-10-CM

## 2020-12-18 LAB — POC URINALSYSI DIPSTICK (AUTOMATED)
Bilirubin, UA: NEGATIVE
Blood, UA: NEGATIVE
Glucose, UA: NEGATIVE
Ketones, UA: NEGATIVE
Leukocytes, UA: NEGATIVE
Nitrite, UA: NEGATIVE
Protein, UA: NEGATIVE
Spec Grav, UA: >=1.03 — AB (ref 1.010–1.025)
Urobilinogen, UA: 1 E.U./dL
pH, UA: 6 (ref 5.0–8.0)

## 2020-12-18 NOTE — Progress Notes (Signed)
VIRTUAL VISIT VIA VIDEO  I connected with Amber Zavala on 12/18/20 at  3:45 PM EDT by elemedicine application and verified that I am speaking with the correct person using two identifiers. Location patient: Home Location provider: [facility], Office Persons participating in the virtual visit: Patient, Dr. Raoul Pitch and Darnell Level. Cesar, CMA  I discussed the limitations of evaluation and management by telemedicine and the availability of in person appointments. The patient expressed understanding and agreed to proceed.   SUBJECTIVE Chief Complaint  Patient presents with  . Urinary Frequency    Pt c/o urine freq and foul odor x 3 days;     HPI: Amber Zavala is a 55 y.o. female present for urinary frequency.  Patient reports her urine has had a strong odor for the last 3 days.  She has increased urinary frequency.  She denies any fevers, chills, nausea, vomiting or abdominal pressure.  She reports she has not changed her diet over the last few days.  ROS: See pertinent positives and negatives per HPI.  Patient Active Problem List   Diagnosis Date Noted  . Gastroesophageal reflux disease without esophagitis 08/09/2020  . Cough 08/09/2020  . COPD (chronic obstructive pulmonary disease) ([place]) 11/20/2015  . Tobacco use 11/29/2014  . OSA (obstructive sleep apnea) 01/01/2012    Social History   Tobacco Use  . Smoking status: Current Every Day Smoker    Packs/day: 1.00    Years: 30.00    Pack years: 30.00    Types: Cigarettes  . Smokeless tobacco: Never Used  Substance Use Topics  . Alcohol use: Yes    Alcohol/week: 0.0 standard drinks    Comment: 4 times a week    Current Outpatient Medications:  .  pantoprazole (PROTONIX) 40 MG tablet, TAKE 1 TABLET BY MOUTH 2 (TWO) TIMES DAILY FOR 30 DAYS, THEN 1 TABLET (40 MG TOTAL) DAILY. (Patient not taking: Reported on 12/18/2020), Disp: 60 tablet, Rfl: 1  Current Facility-Administered Medications:  .  0.9 %  sodium chloride  infusion, 500 mL, Intravenous, Once, Cirigliano, Vito V, DO  No Known Allergies  OBJECTIVE: There were no vitals taken for this visit. Gen: No acute distress. Nontoxic in appearance.  HENT: AT. [place].  MMM.  Eyes:Pupils Equal Round Reactive to light, Extraocular movements intact,  Conjunctiva without redness, discharge or icterus. Neuro:  Alert. Oriented x3  Psych: Normal affect and demeanor. Normal speech. Normal thought content and judgment.  Results for orders placed or performed in visit on 12/18/20 (from the past 24 hour(s))  POCT Urinalysis Dipstick (Automated)     Status: Abnormal   Collection Time: 12/18/20  1:56 PM  Result Value Ref Range   Color, UA Dark Yellow    Clarity, UA Clear    Glucose, UA Negative Negative   Bilirubin, UA negative    Ketones, UA Negative    Spec Grav, UA >=1.030 (A) 1.010 - 1.025   Blood, UA Negative    pH, UA 6.0 5.0 - 8.0   Protein, UA Negative Negative   Urobilinogen, UA 1.0 0.2 or 1.0 E.U./dL   Nitrite, UA Negative    Leukocytes, UA Negative Negative     ASSESSMENT AND PLAN: Amber Zavala is a 55 y.o. female present for  Abnormal urine odor/mild dehydration Urinalysis today appears concentrated.  Discussed common bladder irritants and encouraged increasing water consumption. Will send for urine culture to be complete. - POCT Urinalysis Dipstick (Automated) - Urinalysis w microscopic + reflex cultur Patient will  be called with results and if appropriate antibiotic will be started at that time.   Howard Pouch, DO 12/18/2020   No follow-ups on file.  Orders Placed This Encounter  Procedures  . Urinalysis w microscopic + reflex cultur  . POCT Urinalysis Dipstick (Automated)   No orders of the defined types were placed in this encounter.  Referral Orders  No referral(s) requested today

## 2020-12-19 LAB — URINALYSIS W MICROSCOPIC + REFLEX CULTURE
Bilirubin Urine: NEGATIVE
Glucose, UA: NEGATIVE
Hgb urine dipstick: NEGATIVE
Hyaline Cast: NONE SEEN /LPF
Ketones, ur: NEGATIVE
Leukocyte Esterase: NEGATIVE
Nitrites, Initial: NEGATIVE
Protein, ur: NEGATIVE
RBC / HPF: NONE SEEN /HPF (ref 0–2)
Specific Gravity, Urine: 1.024 (ref 1.001–1.035)
pH: 5.5 (ref 5.0–8.0)

## 2020-12-19 LAB — NO CULTURE INDICATED

## 2021-01-01 ENCOUNTER — Other Ambulatory Visit: Payer: Self-pay | Admitting: Gastroenterology

## 2021-01-02 DIAGNOSIS — U071 COVID-19: Secondary | ICD-10-CM | POA: Diagnosis not present

## 2021-03-05 ENCOUNTER — Encounter: Payer: Self-pay | Admitting: Family Medicine

## 2021-03-05 ENCOUNTER — Telehealth: Payer: BC Managed Care – PPO | Admitting: Family Medicine

## 2021-03-05 ENCOUNTER — Other Ambulatory Visit: Payer: Self-pay

## 2021-03-05 ENCOUNTER — Ambulatory Visit (INDEPENDENT_AMBULATORY_CARE_PROVIDER_SITE_OTHER): Payer: 59 | Admitting: Family Medicine

## 2021-03-05 VITALS — BP 108/72 | HR 84 | Temp 98.5°F | Wt 146.0 lb

## 2021-03-05 DIAGNOSIS — Z720 Tobacco use: Secondary | ICD-10-CM

## 2021-03-05 DIAGNOSIS — R0781 Pleurodynia: Secondary | ICD-10-CM | POA: Diagnosis not present

## 2021-03-05 DIAGNOSIS — Z20822 Contact with and (suspected) exposure to covid-19: Secondary | ICD-10-CM | POA: Diagnosis not present

## 2021-03-05 DIAGNOSIS — R0789 Other chest pain: Secondary | ICD-10-CM

## 2021-03-05 DIAGNOSIS — J449 Chronic obstructive pulmonary disease, unspecified: Secondary | ICD-10-CM | POA: Diagnosis not present

## 2021-03-05 MED ORDER — ALBUTEROL SULFATE HFA 108 (90 BASE) MCG/ACT IN AERS: 2.0000 | INHALATION_SPRAY | Freq: Four times a day (QID) | RESPIRATORY_TRACT | 5 refills | Status: AC | PRN

## 2021-03-05 MED ORDER — PREDNISONE 20 MG PO TABS
ORAL_TABLET | ORAL | 0 refills | Status: DC
Start: 2021-03-05 — End: 2021-05-27

## 2021-03-05 NOTE — Progress Notes (Signed)
VIRTUAL VISIT VIA VIDEO  I connected with Amber Zavala on 03/05/21 at  3:00 PM EDT by elemedicine application and verified that I am speaking with the correct person using two identifiers. Location patient: Home Location provider: [facility], Office Persons participating in the virtual visit: Patient, Dr. Raoul Zavala and Amber Zavala. Amber Zavala, CMA  I discussed the limitations of evaluation and management by telemedicine and the availability of in person appointments. The patient expressed understanding and agreed to proceed.   SUBJECTIVE Chief Complaint  Patient presents with   Chest Pain    Pt c/o chest tightness on right side x 3 days; PMHx of Pleurisy; recently exposed to [place] at work and was sent home yesterday      HPI: Amber Zavala is a 55 y.o. female present for exposure to covid with chest tightness. Symptoms started Saturday felt like someone is stabbing her with a knife. She states symptoms are consistent with pleurisy flare, which she states she had before. She has COPD and is not prescribed inhalers. She is a smoker.  Exposure:yesterday Vaccine:x2 pfizer. Had COVID 4 months ago.  Pt endorses sharp intermittent chest pain on right side x 3 days  Pt denies fever, chills, nausea or vomit. She denies shortness of breath or cough.  Otc: nothing GFR: > 90 Anticoag: n/a  ROS: See pertinent positives and negatives per HPI.  Patient Active Problem List   Diagnosis Date Noted   Gastroesophageal reflux disease without esophagitis 08/09/2020   Cough 08/09/2020   COPD (chronic obstructive pulmonary disease) ([place]) 11/20/2015   Tobacco use 11/29/2014   OSA (obstructive sleep apnea) 01/01/2012    Social History   Tobacco Use   Smoking status: Every Day    Packs/day: 1.00    Years: 30.00    Pack years: 30.00    Types: Cigarettes   Smokeless tobacco: Never  Substance Use Topics   Alcohol use: Yes    Alcohol/week: 0.0 standard drinks    Comment: 4 times a week     Current Outpatient Medications:    albuterol (VENTOLIN HFA) 108 (90 Base) MCG/ACT inhaler, Inhale 2 puffs into the lungs every 6 (six) hours as needed for wheezing or shortness of breath., Disp: 8 g, Rfl: 5   predniSONE (DELTASONE) 20 MG tablet, 60 mg x3d, 40 mg x3d, 20 mg x2d, 10 mg x2d, Disp: 18 tablet, Rfl: 0  No Known Allergies  OBJECTIVE: BP 108/72   Pulse 84   Temp 98.5 F (36.9 C) (Oral)   Wt 146 lb (66.2 kg)   SpO2 98%   BMI 22.20 kg/m  Gen: No acute distress. Nontoxic in appearance.  HENT: AT. [place].  MMM.  Eyes:Pupils Equal Round Reactive to light, Extraocular movements intact,  Conjunctiva without redness, discharge or icterus. Chest: Cough or shortness of breath not present Neuro:  Alert. Oriented x3  Psych: Normal affect and demeanor. Normal speech. Normal thought content and judgment.  ASSESSMENT AND PLAN: Amber Zavala is a 55 y.o. female present for  Close exposure to COVID-19 virus/Pleuritic pain Rest, hydrate. NSAIDS for comfort.  Prednisone prescribed, take until completed.  Albuterol inhaler 1-2 puffs every 4-6 hr prn for wheezing.  Reviewed home care instructions for COVID. Advised self-isolation at home for at least 5 days. After 5 days, if improved and fever resolved, can be in public, but should wear a mask around others for an additional 5 days. If symptoms, esp, dyspnea develops/worsens, recommend in-person evaluation at either an urgent care or  the emergency room.  Tobacco use/chronic obstructive pulmonary disease, unspecified COPD type ([place]) If covid test is positive> will call in molnupiravir for her to start   [facility], DO 03/05/2021   No follow-ups on file.  No orders of the defined types were placed in this encounter.  Meds ordered this encounter  Medications   predniSONE (DELTASONE) 20 MG tablet    Sig: 60 mg x3d, 40 mg x3d, 20 mg x2d, 10 mg x2d    Dispense:  18 tablet    Refill:  0   albuterol (VENTOLIN HFA) 108 (90 Base)  MCG/ACT inhaler    Sig: Inhale 2 puffs into the lungs every 6 (six) hours as needed for wheezing or shortness of breath.    Dispense:  8 g    Refill:  5    Generic albuterol inhaler whichever on formulary. Thanks.   Referral Orders  No referral(s) requested today

## 2021-03-06 LAB — NOVEL CORONAVIRUS, NAA: SARS-CoV-2, NAA: NOT DETECTED

## 2021-03-06 LAB — SARS-COV-2, NAA 2 DAY TAT

## 2021-03-06 NOTE — Progress Notes (Signed)
Switch to video visit.

## 2021-05-23 DIAGNOSIS — Z23 Encounter for immunization: Secondary | ICD-10-CM | POA: Diagnosis not present

## 2021-05-27 ENCOUNTER — Ambulatory Visit: Payer: 59 | Admitting: Family Medicine

## 2021-05-27 ENCOUNTER — Other Ambulatory Visit: Payer: Self-pay

## 2021-05-27 VITALS — BP 134/87 | HR 74 | Temp 97.9°F | Ht 68.0 in | Wt 149.4 lb

## 2021-05-27 DIAGNOSIS — R8290 Unspecified abnormal findings in urine: Secondary | ICD-10-CM | POA: Diagnosis not present

## 2021-05-27 LAB — POCT URINALYSIS DIPSTICK
Bilirubin, UA: NEGATIVE
Blood, UA: NEGATIVE
Glucose, UA: NEGATIVE
Ketones, UA: POSITIVE
Nitrite, UA: POSITIVE
Protein, UA: POSITIVE — AB
Spec Grav, UA: 1.025 (ref 1.010–1.025)
Urobilinogen, UA: 0.2 E.U./dL
pH, UA: 6 (ref 5.0–8.0)

## 2021-05-27 MED ORDER — SULFAMETHOXAZOLE-TRIMETHOPRIM 800-160 MG PO TABS: 1.0000 | ORAL_TABLET | Freq: Two times a day (BID) | ORAL | 0 refills | Status: DC

## 2021-05-27 NOTE — Progress Notes (Signed)
See student note for this encounter.  No classic UTI sx's but abnl urine odor with abnormal matches sx's she had 1 yr ago that resolved with keflex. (I see UA from last year's encounter but no micro or culture result is present in EMR).  I personally was present during the history, physical exam, and medical decision-making activities of this service and have verified that the service and findings are accurately documented in the student's note. Signed:  Crissie Sickles, MD           05/27/2021

## 2021-05-27 NOTE — Progress Notes (Addendum)
OFFICE VISIT  05/27/2021  CC:  Chief Complaint  Patient presents with   Urinary symptom    Vaginal odor for 1 month; has not used any otc medication.    HPI:    Patient is a 55 y.o. female who presents for vaginal odor that start about a month ago. She describes the odor as strong and ammonia like. Odor decreases throughout the day and the patient denies vaginal itchiness, burning, discharge, increased urinary or urge frequency, cough, congestion, and fever. The rest of ROS is negative. Patient has had similar symptoms Oct 2021and was rx'd keflex.    Past Medical History:  Diagnosis Date   COPD (chronic obstructive pulmonary disease) ([place])    Dr. Elsworth Soho   Dysplastic nevus 2012   trunk x3   GERD (gastroesophageal reflux disease)    Hot flashes    Hyperplastic colon polyp 2015   [facility]   Palpitation    Periorbital cellulitis of right eye 03/30/2018   Sleep apnea    Dr. Elsworth Soho   Tobacco abuse     Past Surgical History:  Procedure Laterality Date   APPENDECTOMY  1979   BILATERAL TEMPOROMANDIBULAR JOINT ARTHROPLASTY     BREAST ENHANCEMENT SURGERY     CERVICAL BIOPSY  W/ LOOP ELECTRODE EXCISION  09/2001   COLONOSCOPY W/ BIOPSIES  2015   hyperplastic polyp x1. 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 (Patient not taking: Reported on 05/27/2021) 8 g 5   predniSONE (DELTASONE) 20 MG tablet 60 mg x3d, 40 mg x3d, 20 mg x2d, 10 mg x2d (Patient not taking: Reported on 05/27/2021) 18 tablet 0   No facility-administered medications prior to visit.    No Known Allergies  ROS As per HPI  PE: Vitals with BMI 05/27/2021 03/05/2021 03/05/2021  Height 5\' 8"  - -  Weight 149 lbs 6 oz 146 lbs 146 lbs   BMI 57.32 - -  Systolic 202 542 706  Diastolic 87 72 72  Pulse 74 84 84   Physical Exam Constitutional:      Appearance: Normal appearance.  Cardiovascular:     Rate and Rhythm: Normal rate and regular rhythm.  Pulmonary:     Effort: Pulmonary effort is normal.     Breath sounds: Normal breath sounds.  Neurological:     Mental Status: She is alert.  Psychiatric:        Mood and Affect: Mood normal.        Behavior: Behavior normal.     LABS:     IMPRESSION AND PLAN:  Lack of usual UTI symptoms (increased urinary frequency, urgency, burning during urination) except for vaginal/urinary odor and urine analysis positive for trace leukocytes + nitrite is most consistent with UTI.  Urine will be cultured and TMP-SMX x 3d will be prescribed.  An After Visit Summary was printed and given to the patient.  FOLLOW UP: if not improving  Phil Dopp - MS3  I personally was present during the history, physical exam, and medical decision-making activities of this service and have verified that the service and findings are accurately documented in the student's note. + Signed:  Crissie Sickles, MD  05/27/2021   

## 2021-05-29 LAB — URINE CULTURE
MICRO NUMBER:: 12575194
SPECIMEN QUALITY:: ADEQUATE

## 2021-06-25 DIAGNOSIS — L658 Other specified nonscarring hair loss: Secondary | ICD-10-CM | POA: Diagnosis not present

## 2021-06-25 DIAGNOSIS — L821 Other seborrheic keratosis: Secondary | ICD-10-CM | POA: Diagnosis not present

## 2021-09-03 DIAGNOSIS — Z780 Asymptomatic menopausal state: Secondary | ICD-10-CM | POA: Diagnosis not present

## 2021-09-03 DIAGNOSIS — Z1231 Encounter for screening mammogram for malignant neoplasm of breast: Secondary | ICD-10-CM | POA: Diagnosis not present

## 2021-09-03 DIAGNOSIS — Z1389 Encounter for screening for other disorder: Secondary | ICD-10-CM | POA: Diagnosis not present

## 2021-09-03 DIAGNOSIS — Z130 Encounter for screening for diseases of the blood and blood-forming organs and certain disorders involving the immune mechanism: Secondary | ICD-10-CM | POA: Diagnosis not present

## 2021-09-03 DIAGNOSIS — Z01419 Encounter for gynecological examination (general) (routine) without abnormal findings: Secondary | ICD-10-CM | POA: Diagnosis not present

## 2021-09-13 DIAGNOSIS — N898 Other specified noninflammatory disorders of vagina: Secondary | ICD-10-CM | POA: Diagnosis not present

## 2021-12-02 DIAGNOSIS — J029 Acute pharyngitis, unspecified: Secondary | ICD-10-CM | POA: Diagnosis not present

## 2021-12-02 DIAGNOSIS — N7689 Other specified inflammation of vagina and vulva: Secondary | ICD-10-CM | POA: Diagnosis not present

## 2022-02-13 DIAGNOSIS — N760 Acute vaginitis: Secondary | ICD-10-CM | POA: Diagnosis not present

## 2022-02-13 DIAGNOSIS — N9089 Other specified noninflammatory disorders of vulva and perineum: Secondary | ICD-10-CM | POA: Diagnosis not present

## 2022-02-13 DIAGNOSIS — N898 Other specified noninflammatory disorders of vagina: Secondary | ICD-10-CM | POA: Diagnosis not present

## 2022-02-19 ENCOUNTER — Encounter: Payer: Self-pay | Admitting: Family Medicine

## 2022-02-19 ENCOUNTER — Ambulatory Visit: Payer: 59 | Admitting: Family Medicine

## 2022-02-19 VITALS — BP 107/66 | HR 88 | Temp 98.7°F | Ht 68.0 in | Wt 145.0 lb

## 2022-02-19 DIAGNOSIS — T63461A Toxic effect of venom of wasps, accidental (unintentional), initial encounter: Secondary | ICD-10-CM | POA: Diagnosis not present

## 2022-02-19 MED ORDER — METHYLPREDNISOLONE ACETATE 80 MG/ML IJ SUSP
80.0000 mg | Freq: Once | INTRAMUSCULAR | Status: AC
Start: 2022-02-19 — End: 2022-02-19
  Administered 2022-02-19: 80 mg via INTRAMUSCULAR

## 2022-02-19 NOTE — Patient Instructions (Signed)
Bee, Wasp, or [employer], Adult Bees, wasps, and hornets are part of a family of insects that can sting people. These stings can cause pain and inflammation, but they are usually not serious. However, some people may have an allergic reaction to a sting. This can cause the symptoms to be more severe. What increases the risk? You may be at a greater risk of getting stung if you: Provoke a stinging insect by swatting or disturbing it. Wear strong-smelling soaps, deodorants, or body sprays. Spend time outdoors near gardens with flowers or fruit trees or in clothes that expose skin. Eat or drink outside. What are the signs or symptoms? Common symptoms of this condition include: A red lump in the skin that sometimes has a tiny hole in the center. In some cases, a stinger may be in the center of the wound. Pain and itching at the sting site. Redness and swelling around the sting site. If you have an allergic reaction (localized allergic reaction), the swelling and redness may spread out from the sting site. In some cases, this reaction can continue to develop over the next 24-48 hours. In rare cases, a person may have a severe allergic reaction (anaphylactic reaction) to a sting. Symptoms of an anaphylactic reaction may include: Wheezing or difficulty breathing. Raised, itchy, red patches on the skin (hives). Nausea or vomiting. Abdominal cramping. Diarrhea. Tightness in the chest or chest pain. Dizziness or fainting. Redness of the face (flushing). Hoarse voice. Swollen tongue, lips, or face. How is this diagnosed? This condition is usually diagnosed based on your symptoms and medical history as well as a physical exam. You may have an allergy test to determine if you are allergic to the substance that the insect injected during the sting (venom). How is this treated? If you were stung by a bee, the stinger and a small sac of venom may be in the wound. It is important to remove the stinger as  soon as possible. You can do this by brushing across the wound with gauze, a fingernail, or a flat card such as a credit card. Removing the stinger can help reduce the severity of your body's reaction to the sting. Most stings can be treated with: Icing to reduce swelling in the area. Medicines (antihistamines) to treat itching or an allergic reaction. Medicines to help reduce pain. These may be medicines that you take by mouth, or medicated creams or lotions that you apply to your skin. Pay close attention to your symptoms after you have been stung. If possible, have someone stay with you to make sure you do not have an allergic reaction. If you have any signs of an allergic reaction, call your health care provider. If you have ever had a severe allergic reaction, your health care provider may give you an inhaler or injectable medicine (epinephrine auto-injector) to use if necessary. Follow these instructions at home:  Wash the sting site 2-3 times each day with soap and water as told by your health care provider. Apply or take over-the-counter and prescription medicines only as told by your health care provider. If directed, apply ice to the sting area. Put ice in a plastic bag. Place a towel between your skin and the bag. Leave the ice on for 20 minutes, 2-3 times a day. Do not scratch the sting area. If you had a severe allergic reaction to a sting, you may need: To wear a medical bracelet or necklace that lists the allergy. To learn when and how  to use an anaphylaxis kit or epinephrine injection. Your family members and coworkers may also need to learn this. To carry an anaphylaxis kit or epinephrine injection with you at all times. How is this prevented? Avoid swatting at stinging insects and disturbing insect nests. Do not use fragrant soaps or lotions. Wear shoes, pants, and long sleeves when spending time outdoors, especially in grassy areas where stinging insects are common. Keep  outdoor areas free from nests or hives. Keep food and drink containers covered when eating outdoors. Avoid working or sitting near flowering plants, if possible. Wear gloves if you are gardening or working outdoors. If an attack by a stinging insect or a swarm seems likely in the moment, move away from the area or find a barrier between you and the insect(s), such as a door. Contact a health care provider if: Your symptoms do not get better in 2-3 days. You have redness, swelling, or pain that spreads beyond the area of the sting. You have a fever. Get help right away if: You have symptoms of a severe allergic reaction. These include: Wheezing or difficulty breathing. Tightness in the chest or chest pain. Light-headedness or fainting. Itchy, raised, red patches on the skin. Nausea or vomiting. Abdominal cramping. Diarrhea. A swollen tongue or lips, or trouble swallowing. Dizziness or fainting. Summary Stings from bees, wasps, and hornets can cause pain and inflammation, but they are usually not serious. However, some people may have an allergic reaction to a sting. This can cause the symptoms to be more severe. Pay close attention to your symptoms after you have been stung. If possible, have someone stay with you to make sure you do not have an allergic reaction. Call your health care provider if you have any signs of an allergic reaction. This information is not intended to replace advice given to you by your health care provider. Make sure you discuss any questions you have with your health care provider. Document Revised: 05/06/2020 Document Reviewed: 05/08/2020 Elsevier Patient Education  2023 Elsevier Inc.  

## 2022-02-19 NOTE — Progress Notes (Signed)
Amber Zavala , Jan 21, 1966, 56 y.o., female MRN: [id number] Patient Care Team    Relationship Specialty Notifications Start End  Ma Hillock, DO PCP - General Family Medicine  03/23/18   Paula Compton, MD Consulting Physician Obstetrics and Gynecology  03/24/18   Wallene Huh, [place] Consulting Physician Podiatry  03/24/18   Rigoberto Noel, MD Consulting Physician Pulmonary Disease  03/24/18   Troy Sine, MD Consulting Physician Cardiology  03/24/18     Chief Complaint  Patient presents with   Insect Bite    Pt reports wasp sting on R ankle yesterday;      Subjective: Pt presents for an OV with complaints of wasp sting to her right medial ankle of 1 day duration.  Associated symptoms include red, swelling and itchiness.   Pt has tried hydrocortisone to ease their symptoms. Iy was not helpful.      05/27/2021    3:31 PM 05/23/2020   11:05 AM 03/23/2018    3:16 PM  Depression screen PHQ 2/9  Decreased Interest 0 0 0  Down, Depressed, Hopeless 0 0 0  PHQ - 2 Score 0 0 0    No Known Allergies Social History   Social History Narrative   Marital status/children/pets: Divorced, 1 child   Education/employment: Diploma, works in Press photographer.    Safety:      -smoke alarm in the home:Yes     - wears seatbelt: Yes     - Feels safe in their relationships: Yes   Past Medical History:  Diagnosis Date   COPD (chronic obstructive pulmonary disease) ([place])    Dr. Elsworth Soho   Dysplastic nevus 2012   trunk x3   GERD (gastroesophageal reflux disease)    Hot flashes    Hyperplastic colon polyp 2015   [facility]- [facility]   Palpitation    Periorbital cellulitis of right eye 03/30/2018   Sleep apnea    Dr. Elsworth Soho   Tobacco abuse    Past Surgical History:  Procedure Laterality Date   APPENDECTOMY  1979   BILATERAL TEMPOROMANDIBULAR JOINT ARTHROPLASTY     BREAST ENHANCEMENT SURGERY     CERVICAL BIOPSY  W/ LOOP ELECTRODE EXCISION  09/2001   COLONOSCOPY W/ BIOPSIES  2015    hyperplastic polyp x1. 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 If you have any questions, ask your nurse or doctor.          albuterol 108 (90 Base) MCG/ACT inhaler Commonly known as: VENTOLIN HFA Inhale 2 puffs into the lungs every 6 (six) hours as needed for wheezing or shortness of breath.   sulfamethoxazole-trimethoprim 800-160 MG tablet Commonly known as: BACTRIM DS Take 1 tablet by mouth 2 (two) times daily.        All past medical history,  surgical history, allergies, family history, immunizations andmedications were updated in the EMR today and reviewed under the history and medication portions of their EMR.     Review of Systems  Constitutional:  Negative for chills and fever.  Respiratory:  Negative for cough, shortness of breath and wheezing.    Negative, with the exception of above mentioned in HPI   Objective:  BP 107/66   Pulse 88   Temp 98.7 F (37.1 C) (Oral)   Ht '5\' 8"'$  (1.727 m)   Wt 145 lb (65.8 kg)   SpO2 98%   BMI 22.05 kg/m  Body mass index is 22.05 kg/m. Physical Exam Vitals and nursing note reviewed.  Constitutional:      General: She is not in acute distress.    Appearance: Normal appearance. She is normal weight. She is not ill-appearing or toxic-appearing.  Eyes:     Extraocular Movements: Extraocular movements intact.     Conjunctiva/sclera: Conjunctivae normal.     Pupils: Pupils are equal, round, and reactive to light.  Musculoskeletal:     Right ankle: Swelling present. No  deformity, ecchymosis or lacerations. No tenderness. Normal range of motion.     Left ankle: Normal.     Comments: Mild erythema, swelling  of foot and ankle. Insect bite medial ankle. NV intact distally with good cap refill.   Neurological:     Mental Status: She is alert and oriented to person, place, and time. Mental status is at baseline.  Psychiatric:        Mood and Affect: Mood normal.        Behavior: Behavior normal.        Thought Content: Thought content normal.        Judgment: Judgment normal.      No results found. No results found. No results found for this or any previous visit (from the past 24 hour(s)).  Assessment/Plan: Amber Zavala is a 56 y.o. female present for OV for  Wasp sting, accidental or unintentional, initial encounter No red flags on exam Keep elevated Zyrtec qhs Benadryl gel OTC - methylPREDNISolone acetate (DEPO-MEDROL) injection 80 mg F/u prn  Reviewed expectations re: course of current medical issues. Discussed self-management of symptoms. Outlined signs and symptoms indicating need for more acute intervention. Patient verbalized understanding and all questions were answered. Patient received an After-Visit Summary.    No orders of the defined types were placed in this encounter.  No orders of the defined types were placed in this encounter.  Referral Orders  No referral(s) requested today     Note is dictated utilizing voice recognition software. Although note has been proof read prior to signing, occasional typographical errors still can be missed. If any questions arise, please do not hesitate to call for verification.   electronically signed by:  Howard Pouch, DO  [place]

## 2022-04-10 DIAGNOSIS — B9689 Other specified bacterial agents as the cause of diseases classified elsewhere: Secondary | ICD-10-CM | POA: Diagnosis not present

## 2022-04-10 DIAGNOSIS — L02224 Furuncle of groin: Secondary | ICD-10-CM | POA: Diagnosis not present

## 2022-06-18 DIAGNOSIS — G4733 Obstructive sleep apnea (adult) (pediatric): Secondary | ICD-10-CM | POA: Diagnosis not present

## 2022-06-29 DIAGNOSIS — S82041A Displaced comminuted fracture of right patella, initial encounter for closed fracture: Secondary | ICD-10-CM | POA: Diagnosis not present

## 2022-06-30 DIAGNOSIS — M25561 Pain in right knee: Secondary | ICD-10-CM | POA: Diagnosis not present

## 2022-07-10 DIAGNOSIS — S82001A Unspecified fracture of right patella, initial encounter for closed fracture: Secondary | ICD-10-CM | POA: Diagnosis not present

## 2022-07-31 DIAGNOSIS — S82001A Unspecified fracture of right patella, initial encounter for closed fracture: Secondary | ICD-10-CM | POA: Diagnosis not present

## 2022-08-04 DIAGNOSIS — N898 Other specified noninflammatory disorders of vagina: Secondary | ICD-10-CM | POA: Diagnosis not present

## 2023-02-06 ENCOUNTER — Encounter: Payer: Self-pay | Admitting: Family Medicine

## 2023-02-06 ENCOUNTER — Ambulatory Visit: Payer: BC Managed Care – PPO | Admitting: Family Medicine

## 2023-02-06 VITALS — BP 120/77 | HR 82 | Temp 98.1°F | Wt 139.8 lb

## 2023-02-06 DIAGNOSIS — S161XXA Strain of muscle, fascia and tendon at neck level, initial encounter: Secondary | ICD-10-CM | POA: Diagnosis not present

## 2023-02-06 DIAGNOSIS — L720 Epidermal cyst: Secondary | ICD-10-CM | POA: Diagnosis not present

## 2023-02-06 DIAGNOSIS — B353 Tinea pedis: Secondary | ICD-10-CM

## 2023-02-06 MED ORDER — TERBINAFINE HCL 250 MG PO TABS: 250.0000 mg | ORAL_TABLET | Freq: Every day | ORAL | 0 refills | Status: DC

## 2023-02-06 MED ORDER — CYCLOBENZAPRINE HCL 10 MG PO TABS: 10.0000 mg | ORAL_TABLET | Freq: Three times a day (TID) | ORAL | 0 refills | Status: DC | PRN

## 2023-02-06 NOTE — Progress Notes (Signed)
Amber Zavala , 01-01-1966, 57 y.o., female MRN: [id number] Patient Care Team    Relationship Specialty Notifications Start End  Natalia Leatherwood, DO PCP - General Family Medicine  03/23/18   Huel Cote, MD Consulting Physician Obstetrics and Gynecology  03/24/18   Lenn Sink, [place] Consulting Physician Podiatry  03/24/18   Oretha Milch, MD Consulting Physician Pulmonary Disease  03/24/18   Lennette Bihari, MD Consulting Physician Cardiology  03/24/18     Chief Complaint  Patient presents with   Rash    Thinks it is athlete's foot but not sure; onset 1 month ago     Subjective: Amber Zavala is a 56 y.o. Pt presents for an OV with complaints of the peeling rash of her left foot of 1 month duration.  Over-the-counter Lamisil cream.  She states she did see some improvement with the cream but it is not taking the rash completely away.  Patient points to her left posterior arm, and states that she had a small cyst which she squeezed thick white cheesy fluid and it continues to recur.  She is uncertain if she needs to see a dermatologist.  Patient reports she woke up a few days ago with a stiff neck.  She states she is unable to turn her head towards the right without severe discomfort.  She has taken over-the-counter Advil and is not certain what else she should do to help ease her pain.     02/06/2023    3:40 PM 05/27/2021    3:31 PM 05/23/2020   11:05 AM 03/23/2018    3:16 PM  Depression screen PHQ 2/9  Decreased Interest 0 0 0 0  Down, Depressed, Hopeless 0 0 0 0  PHQ - 2 Score 0 0 0 0    No Known Allergies Social History   Social History Narrative   Marital status/children/pets: Divorced, 1 child   Education/employment: Diploma, works in Audiological scientist.    Safety:      -smoke alarm in the home:Yes     - wears seatbelt: Yes     - Feels safe in their relationships: Yes   Past Medical History:  Diagnosis Date   COPD (chronic obstructive pulmonary disease)  ([facility])    Dr. Vassie Loll   Dysplastic nevus 2012   trunk x3   GERD (gastroesophageal reflux disease)    Hot flashes    Hyperplastic colon polyp 2015   [facility]   Palpitation    Periorbital cellulitis of right eye 03/30/2018   Sleep apnea    Dr. Vassie Loll   Tobacco abuse    Past Surgical History:  Procedure Laterality Date   APPENDECTOMY  1979   BILATERAL TEMPOROMANDIBULAR JOINT ARTHROPLASTY     BREAST ENHANCEMENT SURGERY     CERVICAL BIOPSY  W/ LOOP ELECTRODE EXCISION  09/2001   COLONOSCOPY W/ BIOPSIES  2015   hyperplastic polyp x1. 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 If you have any questions, ask your nurse or doctor.          STOP taking these medications    sulfamethoxazole-trimethoprim 800-160 MG tablet Commonly known as: BACTRIM DS Stopped by: Felix Pacini       TAKE these medications    albuterol 108 (90 Base) MCG/ACT inhaler Commonly known as: VENTOLIN HFA Inhale 2 puffs into the lungs every 6 (six) hours as needed for wheezing or shortness of breath.   cyclobenzaprine 10 MG tablet Commonly known as: FLEXERIL Take 1 tablet (10 mg total) by mouth 3 (three) times daily as needed for muscle spasms. Started by: Felix Pacini   terbinafine 250 MG tablet Commonly known as: LAMISIL Take 1 tablet (250 mg total) by mouth daily. Started by: Felix Pacini        All past medical history, surgical history, allergies, family history, immunizations andmedications were updated in the EMR today and reviewed  under the history and medication portions of their EMR.     ROS Negative, with the exception of above mentioned in HPI   Objective:  BP 120/77   Pulse 82   Temp 98.1 F (36.7 C)   Wt 139 lb 12.8 oz (63.4 kg)   SpO2 99%   BMI 21.26 kg/m  Body mass index is 21.26 kg/m.  Physical Exam Vitals and nursing note reviewed.  Constitutional:      General: She is not in acute distress.    Appearance: Normal appearance. She is normal weight. She is not ill-appearing or toxic-appearing.  HENT:     Head: Normocephalic and atraumatic.  Eyes:     General: No scleral icterus.       Right eye: No discharge.        Left eye: No discharge.     Extraocular Movements: Extraocular movements intact.     Conjunctiva/sclera: Conjunctivae normal.     Pupils: Pupils are equal, round, and reactive to light.  Musculoskeletal:     Comments: Neck: Tender to palpation over right side of neck.  No bony tenderness.  Decreased range of motion right rotation.  Neurovascular intact distally  Skin:    Findings: Rash (Dry peeling mildly erythemic rash of left foot arch and heel) present.     Comments: Left arm: Small epidermal cyst left posterior arm.  No erythema, no drainage.  Neurological:     Mental Status: She is alert and oriented to person, place, and time. Mental status is at baseline.     Motor: No weakness.     Coordination: Coordination normal.     Gait: Gait normal.  Psychiatric:        Mood and Affect: Mood normal.        Behavior: Behavior normal.        Thought Content: Thought content normal.        Judgment: Judgment normal.      No results found. No results found. No results found for this or any previous visit (from the past 24 hour(s)).  Assessment/Plan: Amber Zavala is a 57 y.o. female present for OV for  Neck strain, initial encounter Encourage patient to continue heat application and stretches. Flexeril 5-10 mg twice daily as needed Continue NSAIDs as  needed  Epidermal cyst Reassured patient area on her arm is a small epidermal cyst.  It is not infectious at this time.  Patient understands she would need to see a dermatologist to have area removed in order for the cyst not to continue to refill.  She will schedule with her dermatologist.  Tinea pedis of left foot Athlete's foot, left has been resistant to over-the-counter products over 4 weeks duration. Terbinafine 250 mg daily x 14 days prescribed.  Patient was encouraged to continue the over-the-counter products and ensure she is using Lamisil spray within her shoes.   Return if symptoms worsen or fail to improve.  Reviewed expectations re: course of current medical issues. Discussed self-management of symptoms. Outlined signs and symptoms indicating need for more acute intervention. Patient verbalized understanding and all questions were answered. Patient received an After-Visit Summary.    No orders of the defined types were placed in this encounter.  Meds ordered this encounter  Medications   terbinafine (LAMISIL) 250 MG tablet    Sig: Take 1 tablet (250 mg total) by mouth daily.    Dispense:  14 tablet    Refill:  0   cyclobenzaprine (FLEXERIL) 10 MG tablet    Sig: Take 1 tablet (10 mg total) by mouth 3 (three) times daily as needed for muscle spasms.    Dispense:  30 tablet    Refill:  0   Referral Orders  No referral(s) requested today     Note is dictated utilizing voice recognition software. Although note has been proof read prior to signing, occasional typographical errors still can be missed. If any questions arise, please do not hesitate to call for verification.   electronically signed by:  Felix Pacini, DO  [facility] [place]

## 2023-02-06 NOTE — Patient Instructions (Addendum)
Return if symptoms worsen or fail to improve.        Great to see you today.  I have refilled the medication(s) we provide.   If labs were collected, we will inform you of lab results once received either by echart message or telephone call.   - echart message- for normal results that have been seen by the patient already.   - telephone call: abnormal results or if patient has not viewed results in their echart.  

## 2023-02-11 ENCOUNTER — Ambulatory Visit: Payer: BC Managed Care – PPO | Admitting: Family Medicine

## 2023-02-11 ENCOUNTER — Encounter: Payer: Self-pay | Admitting: Family Medicine

## 2023-02-11 VITALS — BP 130/79 | HR 70 | Temp 98.0°F | Wt 137.6 lb

## 2023-02-11 DIAGNOSIS — J029 Acute pharyngitis, unspecified: Secondary | ICD-10-CM

## 2023-02-11 DIAGNOSIS — B9689 Other specified bacterial agents as the cause of diseases classified elsewhere: Secondary | ICD-10-CM

## 2023-02-11 DIAGNOSIS — J028 Acute pharyngitis due to other specified organisms: Secondary | ICD-10-CM | POA: Diagnosis not present

## 2023-02-11 LAB — POCT RAPID STREP A (OFFICE): Rapid Strep A Screen: NEGATIVE

## 2023-02-11 LAB — POC COVID19 BINAXNOW: SARS Coronavirus 2 Ag: NEGATIVE

## 2023-02-11 MED ORDER — AMOXICILLIN-POT CLAVULANATE 875-125 MG PO TABS: 1.0000 | ORAL_TABLET | Freq: Two times a day (BID) | ORAL | 0 refills | Status: DC

## 2023-02-11 MED ORDER — FLUCONAZOLE 150 MG PO TABS: 150.0000 mg | ORAL_TABLET | Freq: Once | ORAL | 0 refills | Status: AC

## 2023-02-11 NOTE — Patient Instructions (Addendum)
Return in about 2 weeks (around 02/25/2023), or if symptoms worsen or fail to improve.        Great to see you today.  I have refilled the medication(s) we provide.   If labs were collected, we will inform you of lab results once received either by echart message or telephone call.   - echart message- for normal results that have been seen by the patient already.   - telephone call: abnormal results or if patient has not viewed results in their echart.

## 2023-02-11 NOTE — Progress Notes (Signed)
Amber Zavala , [birth year], 57 y.o., female MRN: [id number] Patient Care Team    Relationship Specialty Notifications Start End  Natalia Leatherwood, DO PCP - General Family Medicine  03/23/18   Huel Cote, MD Consulting Physician Obstetrics and Gynecology  03/24/18   Lenn Sink, [place] Consulting Physician Podiatry  03/24/18   Oretha Milch, MD Consulting Physician Pulmonary Disease  03/24/18   Lennette Bihari, MD Consulting Physician Cardiology  03/24/18     Chief Complaint  Patient presents with   Sore Throat    Sore throat for about 2 days. No covid testing done.     Subjective: Amber Zavala is a 57 y.o. Pt presents for an OV with complaints of sore throat of 2-3 days duration.  Associated symptoms include cough with sputum.  No exposure to sick contacts.        02/06/2023    3:40 PM 05/27/2021    3:31 PM 05/23/2020   11:05 AM 03/23/2018    3:16 PM  Depression screen PHQ 2/9  Decreased Interest 0 0 0 0  Down, Depressed, Hopeless 0 0 0 0  PHQ - 2 Score 0 0 0 0    No Known Allergies Social History   Social History Narrative   Marital status/children/pets: Divorced, 1 child   Education/employment: Diploma, works in Audiological scientist.    Safety:      -smoke alarm in the home:Yes     - wears seatbelt: Yes     - Feels safe in their relationships: Yes   Past Medical History:  Diagnosis Date   COPD (chronic obstructive pulmonary disease) (HCC)    Dr. Vassie Loll   Dysplastic nevus 2012   trunk x3   GERD (gastroesophageal reflux disease)    Hot flashes    Hyperplastic colon polyp 2015   [facility]   Palpitation    Periorbital cellulitis of right eye 03/30/2018   Sleep apnea    Dr. Vassie Loll   Tobacco abuse    Past Surgical History:  Procedure Laterality Date   APPENDECTOMY  1979   BILATERAL TEMPOROMANDIBULAR JOINT ARTHROPLASTY     BREAST ENHANCEMENT SURGERY     CERVICAL BIOPSY  W/ LOOP ELECTRODE EXCISION  09/2001   COLONOSCOPY W/ BIOPSIES  2015    hyperplastic polyp x1. 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 If you have any questions, ask your nurse or doctor.          albuterol 108 (90 Base) MCG/ACT inhaler Commonly known as: VENTOLIN HFA Inhale 2 puffs into the lungs every 6 (six) hours as needed for wheezing or shortness of breath.   cyclobenzaprine 10 MG tablet Commonly known as: FLEXERIL Take 1 tablet (10 mg total) by mouth 3 (three) times daily as needed for muscle spasms.  terbinafine 250 MG tablet Commonly known as: LAMISIL Take 1 tablet (250 mg total) by mouth daily.        All past medical history, surgical history, allergies, family history, immunizations andmedications were updated in the EMR today and reviewed under the history and medication portions of their EMR.     Review of Systems  Constitutional:  Positive for malaise/fatigue. Negative for chills and fever.  HENT:  Positive for sore throat. Negative for congestion, ear pain and sinus pain.   Respiratory:  Positive for cough. Negative for shortness of breath and wheezing.   Gastrointestinal:  Negative for diarrhea, nausea and vomiting.  Skin:  Negative for rash.  Neurological:  Positive for headaches. Negative for dizziness.   Negative, with the exception of above mentioned in HPI   Objective:  BP 130/79   Pulse 70   Temp 98 F (36.7 C) (Oral)   Wt 137 lb 9.6 oz (62.4 kg)   SpO2 100%   BMI 20.92 kg/m  Body mass index is 20.92 kg/m. Physical Exam Vitals and nursing note reviewed.   Constitutional:      General: She is not in acute distress.    Appearance: Normal appearance. She is normal weight. She is not ill-appearing or toxic-appearing.  HENT:     Head: Normocephalic and atraumatic.     Right Ear: Tympanic membrane and ear canal normal. There is no impacted cerumen.     Left Ear: Tympanic membrane and ear canal normal. There is no impacted cerumen.     Nose: Congestion and rhinorrhea present.     Mouth/Throat:     Mouth: Mucous membranes are moist.     Tongue: No lesions.     Palate: No lesions.     Pharynx: Pharyngeal swelling, posterior oropharyngeal erythema and postnasal drip present. No oropharyngeal exudate.  Eyes:     General: No scleral icterus.       Right eye: No discharge.        Left eye: No discharge.     Extraocular Movements: Extraocular movements intact.     Conjunctiva/sclera: Conjunctivae normal.     Pupils: Pupils are equal, round, and reactive to light.  Cardiovascular:     Rate and Rhythm: Normal rate and regular rhythm.  Pulmonary:     Effort: Pulmonary effort is normal.     Breath sounds: Normal breath sounds.  Musculoskeletal:     Cervical back: Neck supple. Tenderness present.  Lymphadenopathy:     Cervical: Cervical adenopathy present.  Skin:    Findings: No rash.  Neurological:     Mental Status: She is alert and oriented to person, place, and time. Mental status is at baseline.     Motor: No weakness.     Coordination: Coordination normal.     Gait: Gait normal.  Psychiatric:        Mood and Affect: Mood normal.        Behavior: Behavior normal.        Thought Content: Thought content normal.        Judgment: Judgment normal.     No results found. No results found. No results found for this or any previous visit (from the past 24 hour(s)).  Assessment/Plan: Amber Zavala is a 57 y.o. female present for OV for  Sore throat/ Bacterial pharyngitis - POC COVID-19 BinaxNow> neg - POCT rapid strep A.neg Suspect  early strep with sx and exam today. Rest, hydrate.  OTC: mucinex (DM if cough), nettie pot  or nasal saline.  augmentin prescribed, take until completed. (Diflucan script printed if needed) If cough present it can last up to 6-8 weeks.  F/U 2 weeks if not improved.   Reviewed expectations re: course of current medical issues. Discussed self-management of symptoms. Outlined signs and symptoms indicating need for more acute intervention. Patient verbalized understanding and all questions were answered. Patient received an After-Visit Summary.    Orders Placed This Encounter  Procedures   POC COVID-19 BinaxNow   POCT rapid strep A   No orders of the defined types were placed in this encounter.  Referral Orders  No referral(s) requested today     Note is dictated utilizing voice recognition software. Although note has been proof read prior to signing, occasional typographical errors still can be missed. If any questions arise, please do not hesitate to call for verification.   electronically signed by:  Felix Pacini, DO  [facility] - [place]

## 2023-02-24 ENCOUNTER — Telehealth: Payer: Self-pay | Admitting: Family Medicine

## 2023-02-24 NOTE — Telephone Encounter (Signed)
Patient reports continued pain with stiff neck from previous visit. She reports that the muscle relaxer did not seem to help much, and would like to know if something else can be called in?  Please give the patient a call either way. She declined scheduling appt.

## 2023-02-24 NOTE — Telephone Encounter (Signed)
Spoke with patient regarding results/recommendations.  

## 2023-02-27 ENCOUNTER — Encounter: Payer: Self-pay | Admitting: Family Medicine

## 2023-02-27 ENCOUNTER — Ambulatory Visit: Payer: BC Managed Care – PPO | Admitting: Family Medicine

## 2023-02-27 VITALS — BP 131/84 | HR 81 | Temp 98.0°F | Wt 138.6 lb

## 2023-02-27 DIAGNOSIS — M542 Cervicalgia: Secondary | ICD-10-CM

## 2023-02-27 DIAGNOSIS — S161XXD Strain of muscle, fascia and tendon at neck level, subsequent encounter: Secondary | ICD-10-CM | POA: Diagnosis not present

## 2023-02-27 MED ORDER — METHYLPREDNISOLONE 4 MG PO TBPK: ORAL_TABLET | ORAL | 0 refills | Status: DC

## 2023-02-27 MED ORDER — TIZANIDINE HCL 4 MG PO TABS: 4.0000 mg | ORAL_TABLET | Freq: Three times a day (TID) | ORAL | 0 refills | Status: DC | PRN

## 2023-02-27 NOTE — Progress Notes (Signed)
Amber Zavala , 11-Mar-1966, 57 y.o., female MRN: [id number] Patient Care Team    Relationship Specialty Notifications Start End  Natalia Leatherwood, DO PCP - General Family Medicine  03/23/18   Huel Cote, MD Consulting Physician Obstetrics and Gynecology  03/24/18   Lenn Sink, [place] Consulting Physician Podiatry  03/24/18   Oretha Milch, MD Consulting Physician Pulmonary Disease  03/24/18   Lennette Bihari, MD Consulting Physician Cardiology  03/24/18     Chief Complaint  Patient presents with   Neck Pain    Has not gotten any better since 07/12 appt     Subjective: Amber Zavala is a 57 y.o. Pt presents for an OV to follow up on neck discomfort.  Patient was evaluated 3 weeks ago for neck strain and started on Flexeril as needed, encouraged to use heat application, stretches and massage.  NSAIDs as needed was encouraged.  Today she reports she is still having pain in her neck, mostly right side. She did not noticed a difference with nsaids, flexeril, stretches and heat. She denies radiation of pain in ext.   Prior note: Patient reports she woke up a few days ago with a stiff neck.  She states she is unable to turn her head towards the right without severe discomfort.  She has taken over-the-counter Advil and is not certain what else she should do to help ease her pain.     02/06/2023    3:40 PM 05/27/2021    3:31 PM 05/23/2020   11:05 AM 03/23/2018    3:16 PM  Depression screen PHQ 2/9  Decreased Interest 0 0 0 0  Down, Depressed, Hopeless 0 0 0 0  PHQ - 2 Score 0 0 0 0    No Known Allergies Social History   Social History Narrative   Marital status/children/pets: Divorced, 1 child   Education/employment: Diploma, works in Audiological scientist.    Safety:      -smoke alarm in the home:Yes     - wears seatbelt: Yes     - Feels safe in their relationships: Yes   Past Medical History:  Diagnosis Date   COPD (chronic obstructive pulmonary disease) (HCC)    Dr.  Vassie Loll   Dysplastic nevus 2012   trunk x3   GERD (gastroesophageal reflux disease)    Hot flashes    Hyperplastic colon polyp 2015   [facility]   Palpitation    Periorbital cellulitis of right eye 03/30/2018   Sleep apnea    Dr. Vassie Loll   Tobacco abuse    Past Surgical History:  Procedure Laterality Date   APPENDECTOMY  1979   BILATERAL TEMPOROMANDIBULAR JOINT ARTHROPLASTY     BREAST ENHANCEMENT SURGERY     CERVICAL BIOPSY  W/ LOOP ELECTRODE EXCISION  09/2001   COLONOSCOPY W/ BIOPSIES  2015   hyperplastic polyp x1. 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 If you have any questions, ask your nurse or doctor.          STOP taking these medications    amoxicillin-clavulanate 875-125 MG tablet Commonly known as: AUGMENTIN Stopped by: Felix Pacini   cyclobenzaprine 10 MG tablet Commonly known as: FLEXERIL Stopped by: Felix Pacini       TAKE these medications    albuterol 108 (90 Base) MCG/ACT inhaler Commonly known as: VENTOLIN HFA Inhale 2 puffs into the lungs every 6 (six) hours as needed for wheezing or shortness of breath.   methylPREDNISolone 4 MG Tbpk tablet Commonly known as: MEDROL DOSEPAK Per dose pack instructions Started by: Felix Pacini   terbinafine 250 MG tablet Commonly known as: LAMISIL Take 1 tablet (250 mg total) by mouth daily.   tiZANidine 4 MG tablet Commonly known as: Zanaflex Take 1 tablet (4 mg total) by mouth every 8 (eight) hours as needed for muscle spasms. Started by: Felix Pacini        All past medical history, surgical history, allergies, family history, immunizations andmedications were updated in the EMR today and reviewed under the history and medication portions of their EMR.     ROS Negative, with the exception of above mentioned in HPI   Objective:  BP 131/84   Pulse 81   Temp 98 F (36.7 C)   Wt 138 lb 9.6 oz (62.9 kg)   SpO2 98%   BMI 21.07 kg/m  Body mass index is 21.07 kg/m.  Physical Exam Vitals and nursing note reviewed.  Constitutional:      General: She is not in acute distress.    Appearance: Normal appearance. She is normal weight. She is not ill-appearing or toxic-appearing.  HENT:     Head: Normocephalic and atraumatic.  Eyes:     General: No scleral icterus.       Right eye: No discharge.        Left eye: No discharge.     Extraocular Movements: Extraocular movements intact.     Conjunctiva/sclera: Conjunctivae normal.     Pupils: Pupils are equal, round, and reactive to light.  Musculoskeletal:     Comments: Neck: no longer TTP.  No bony tenderness.  Decreased range of motion right rotation and left rotation now. .  Neurovascular intact distally  Neurological:     Mental Status: She is alert and oriented to person, place, and time. Mental status is at baseline.     Motor: No weakness.     Coordination: Coordination normal.     Gait: Gait normal.  Psychiatric:        Mood and Affect: Mood normal.        Behavior: Behavior normal.        Thought Content: Thought content normal.        Judgment: Judgment normal.      No results found. No results found. No results found for this or any previous visit (from the past 24 hour(s)).  Assessment/Plan: Amber Zavala is a 57 y.o. female present for OV for  Neck pain/neck strain Encourage patient to continue heat application and stretches. Zanaflex prescribed in place of flexeril.  Depo medrol pack prescribed Referral to [facility] for omt eval and treat    Return  if symptoms worsen or fail to improve.  Reviewed expectations re: course of current medical issues. Discussed self-management of symptoms. Outlined signs and  symptoms indicating need for more acute intervention. Patient verbalized understanding and all questions were answered. Patient received an After-Visit Summary.    Orders Placed This Encounter  Procedures   Ambulatory referral to Sports Medicine   Meds ordered this encounter  Medications   methylPREDNISolone (MEDROL DOSEPAK) 4 MG TBPK tablet    Sig: Per dose pack instructions    Dispense:  21 each    Refill:  0   tiZANidine (ZANAFLEX) 4 MG tablet    Sig: Take 1 tablet (4 mg total) by mouth every 8 (eight) hours as needed for muscle spasms.    Dispense:  90 tablet    Refill:  0   Referral Orders         Ambulatory referral to Sports Medicine       Note is dictated utilizing voice recognition software. Although note has been proof read prior to signing, occasional typographical errors still can be missed. If any questions arise, please do not hesitate to call for verification.   electronically signed by:  Felix Pacini, DO  [facility] [place]

## 2023-02-27 NOTE — Patient Instructions (Addendum)

## 2023-03-03 ENCOUNTER — Other Ambulatory Visit: Payer: Self-pay | Admitting: Family Medicine

## 2023-03-03 DIAGNOSIS — Z1231 Encounter for screening mammogram for malignant neoplasm of breast: Secondary | ICD-10-CM

## 2023-03-12 NOTE — Progress Notes (Unsigned)
    [facility] [address] [place] [address] Phone: [phone]   Assessment and Plan:     There are no diagnoses linked to this encounter.  ***   Pertinent previous records reviewed include ***   Follow Up: ***     Subjective:   I, Amber Zavala, am serving as a Neurosurgeon for Doctor Richardean Sale  Chief Complaint: neck pain   HPI:   03/16/2023 Patient is a 57 year old female complaining of neck pain . Patient states   Relevant Historical Information: ***  Additional pertinent review of systems negative.   Current Outpatient Medications:    albuterol (VENTOLIN HFA) 108 (90 Base) MCG/ACT inhaler, Inhale 2 puffs into the lungs every 6 (six) hours as needed for wheezing or shortness of breath. (Patient not taking: Reported on 02/06/2023), Disp: 8 g, Rfl: 5   methylPREDNISolone (MEDROL DOSEPAK) 4 MG TBPK tablet, Per dose pack instructions, Disp: 21 each, Rfl: 0   terbinafine (LAMISIL) 250 MG tablet, Take 1 tablet (250 mg total) by mouth daily., Disp: 14 tablet, Rfl: 0   tiZANidine (ZANAFLEX) 4 MG tablet, Take 1 tablet (4 mg total) by mouth every 8 (eight) hours as needed for muscle spasms., Disp: 90 tablet, Rfl: 0   Objective:     There were no vitals filed for this visit.    There is no height or weight on file to calculate BMI.    Physical Exam:    ***   Electronically signed by:  [facility] 12:47 PM 03/12/23

## 2023-03-16 ENCOUNTER — Encounter: Payer: BC Managed Care – PPO | Admitting: Sports Medicine

## 2023-03-19 DIAGNOSIS — Q828 Other specified congenital malformations of skin: Secondary | ICD-10-CM | POA: Diagnosis not present

## 2023-03-19 DIAGNOSIS — L728 Other follicular cysts of the skin and subcutaneous tissue: Secondary | ICD-10-CM | POA: Diagnosis not present

## 2023-03-21 ENCOUNTER — Other Ambulatory Visit: Payer: Self-pay | Admitting: Family Medicine

## 2023-03-23 ENCOUNTER — Ambulatory Visit: Payer: BC Managed Care – PPO | Admitting: Sports Medicine

## 2023-03-23 ENCOUNTER — Ambulatory Visit (INDEPENDENT_AMBULATORY_CARE_PROVIDER_SITE_OTHER): Payer: BC Managed Care – PPO

## 2023-03-23 VITALS — BP 124/82 | HR 102 | Ht 68.0 in | Wt 140.0 lb

## 2023-03-23 DIAGNOSIS — M542 Cervicalgia: Secondary | ICD-10-CM

## 2023-03-23 MED ORDER — CYCLOBENZAPRINE HCL 5 MG PO TABS: 5.0000 mg | ORAL_TABLET | Freq: Three times a day (TID) | ORAL | 0 refills | Status: DC | PRN

## 2023-03-23 MED ORDER — MELOXICAM 15 MG PO TABS: 15.0000 mg | ORAL_TABLET | Freq: Every day | ORAL | 0 refills | Status: DC

## 2023-03-23 NOTE — Patient Instructions (Addendum)
-   Start meloxicam 15 mg daily x2 weeks.  If still having pain after 2 weeks, complete 3rd-week of meloxicam. May use remaining meloxicam as needed once daily for pain control.  Do not to use additional NSAIDs while taking meloxicam.  May use Tylenol [phone] mg 2 to 3 times a day for breakthrough pain. Flexeril 5-10 mg nightly as needed for muscle spasm  4 week follow up  Neck HEP

## 2023-03-23 NOTE — Progress Notes (Signed)
    Amber Zavala D.[facility] [address] [place] [address] Phone: [phone]   Assessment and Plan:     1. Neck pain  -Subacute, uncomplicated, initial sports medicine visit - Most consistent with cervical muscular strain likely originally occurring during sleep and flaring since that time - X-ray obtained in clinic.  My interpretation: No acute fracture or vertebral collapse.  Unremarkable imaging. - Start meloxicam 15 mg daily x2 weeks.  If still having pain after 2 weeks, complete 3rd-week of meloxicam. May use remaining meloxicam as needed once daily for pain control.  Do not to use additional NSAIDs while taking meloxicam.  May use Tylenol [phone] mg 2 to 3 times a day for breakthrough pain. - Start Flexeril 5 to 10 mg nightly as needed for muscle spasms - Start HEP for neck  Pertinent previous records reviewed include family medicine note 02/27/2023   Follow Up: 4 weeks for reevaluation.  If no improvement or worsening of symptoms, could consider physical therapy versus discussing OMT   Subjective:   I, Amber Zavala, am serving as a Neurosurgeon for Doctor Richardean Sale  Chief Complaint: neck pain   HPI:   03/23/23 Patient is a 56 year old female complaining of neck pain. Patient states  that she has a very stiff neck for about 2 months. Muscle relaxer's and stretching don't help much. She has a constant dull headache. No numbness or tingling. No MOI. Ibu doesn't help with the pain. Decreased ROM due to tightness   Relevant Historical Information: COPD, GERD  Additional pertinent review of systems negative.   Current Outpatient Medications:    albuterol (VENTOLIN HFA) 108 (90 Base) MCG/ACT inhaler, Inhale 2 puffs into the lungs every 6 (six) hours as needed for wheezing or shortness of breath., Disp: 8 g, Rfl: 5   cyclobenzaprine (FLEXERIL) 5 MG tablet, Take 1 tablet (5 mg total) by mouth 3 (three) times daily as needed for muscle  spasms., Disp: 30 tablet, Rfl: 0   meloxicam (MOBIC) 15 MG tablet, Take 1 tablet (15 mg total) by mouth daily., Disp: 30 tablet, Rfl: 0   terbinafine (LAMISIL) 250 MG tablet, Take 1 tablet (250 mg total) by mouth daily., Disp: 14 tablet, Rfl: 0   Objective:     Vitals:   03/23/23 1544  BP: 124/82  Pulse: (!) 102  SpO2: 98%  Weight: 140 lb (63.5 kg)  Height: 5\' 8"  (1.727 m)      Body mass index is 21.29 kg/m.    Physical Exam:    Neck Exam: Cervical Spine- Posture normal Skin- normal, intact  Neuro:  Strength-  Right Left   Deltoid (C5) 5/5 5/5  Bicep/Brachioradialis (C5/6) 5/5  5/5  Wrist Extension (C6) 5/5 5/5  Tricep (C7) 5/5 5/5  Wrist Flexion (C7) 5/5 5/5  Grip (C8) 5/5 5/5  Finger Abduction (T1) 5/5 5/5   Sensation: intact to light touch in upper extremities bilaterally  Spurling's:  negative bilaterally Neck ROM: Tightness with mildly reduced rotation and sidebending bilaterally NTTP: cervical spinous processes, cervical paraspinal, thoracic paraspinal, trapezius    Electronically signed by:  Amber Zavala D.[facility] 4:08 PM 03/23/23

## 2023-04-08 NOTE — Progress Notes (Unsigned)
    Amber Zavala D.[facility] [address] [place] [address] Phone: [phone]   Assessment and Plan:     There are no diagnoses linked to this encounter.  ***   Pertinent previous records reviewed include ***   Follow Up: ***     Subjective:   I, Amber Zavala, am serving as a Neurosurgeon for Doctor Richardean Sale   Chief Complaint: neck pain    HPI:    03/23/23 Patient is a 57 year old female complaining of neck pain. Patient states  that she has a very stiff neck for about 2 months. Muscle relaxer's and stretching don't help much. She has a constant dull headache. No numbness or tingling. No MOI. Ibu doesn't help with the pain. Decreased ROM due to tightness   04/09/2023 Patient states    Relevant Historical Information: COPD, GERD  Additional pertinent review of systems negative.   Current Outpatient Medications:    albuterol (VENTOLIN HFA) 108 (90 Base) MCG/ACT inhaler, Inhale 2 puffs into the lungs every 6 (six) hours as needed for wheezing or shortness of breath., Disp: 8 g, Rfl: 5   cyclobenzaprine (FLEXERIL) 5 MG tablet, Take 1 tablet (5 mg total) by mouth 3 (three) times daily as needed for muscle spasms., Disp: 30 tablet, Rfl: 0   meloxicam (MOBIC) 15 MG tablet, Take 1 tablet (15 mg total) by mouth daily., Disp: 30 tablet, Rfl: 0   terbinafine (LAMISIL) 250 MG tablet, Take 1 tablet (250 mg total) by mouth daily., Disp: 14 tablet, Rfl: 0   Objective:     There were no vitals filed for this visit.    There is no height or weight on file to calculate BMI.    Physical Exam:    ***   Electronically signed by:  Amber Zavala D.[facility] 7:24 AM 04/08/23

## 2023-04-09 ENCOUNTER — Ambulatory Visit: Payer: BC Managed Care – PPO | Admitting: Sports Medicine

## 2023-04-09 VITALS — BP 122/84 | HR 61 | Ht 68.0 in | Wt 141.0 lb

## 2023-04-09 DIAGNOSIS — M542 Cervicalgia: Secondary | ICD-10-CM

## 2023-04-09 MED ORDER — METHYLPREDNISOLONE 4 MG PO TBPK: ORAL_TABLET | ORAL | 0 refills | Status: DC

## 2023-04-09 NOTE — Patient Instructions (Signed)
[facility]ri referral  Follow up 4-5 days after to discuss results Prednisone dos pak

## 2023-04-12 ENCOUNTER — Ambulatory Visit (INDEPENDENT_AMBULATORY_CARE_PROVIDER_SITE_OTHER): Payer: BC Managed Care – PPO

## 2023-04-12 DIAGNOSIS — M542 Cervicalgia: Secondary | ICD-10-CM

## 2023-04-12 DIAGNOSIS — G8929 Other chronic pain: Secondary | ICD-10-CM | POA: Diagnosis not present

## 2023-04-12 DIAGNOSIS — M47812 Spondylosis without myelopathy or radiculopathy, cervical region: Secondary | ICD-10-CM | POA: Diagnosis not present

## 2023-04-12 DIAGNOSIS — M5020 Other cervical disc displacement, unspecified cervical region: Secondary | ICD-10-CM | POA: Diagnosis not present

## 2023-04-12 DIAGNOSIS — M4802 Spinal stenosis, cervical region: Secondary | ICD-10-CM | POA: Diagnosis not present

## 2023-04-19 ENCOUNTER — Other Ambulatory Visit: Payer: Self-pay | Admitting: Sports Medicine

## 2023-04-21 ENCOUNTER — Ambulatory Visit: Payer: BC Managed Care – PPO | Admitting: Sports Medicine

## 2023-04-23 ENCOUNTER — Ambulatory Visit: Payer: BC Managed Care – PPO | Admitting: Sports Medicine

## 2023-04-23 DIAGNOSIS — M542 Cervicalgia: Secondary | ICD-10-CM

## 2023-04-23 DIAGNOSIS — M4802 Spinal stenosis, cervical region: Secondary | ICD-10-CM

## 2023-04-23 NOTE — Progress Notes (Signed)
    Aleen Sells D.[facility] [address] [place] [address] Phone: [phone]    Virtual Visit Note  Assessment and Plan:      1. Neck pain 2. Cervicalgia 3. Spinal stenosis in cervical region  -Chronic with exacerbation, subsequent visit - Overall moderate to significant improvement after prednisone Dosepak provided at previous office visit - Reviewed MRI results with patient over the phone.  We discussed moderate cervical spinal stenosis at C5-C6 with posteriorly displaced disc that could be contributing to patient's symptoms.  Otherwise only mild degenerative changes and unremarkable MRI - Based on patient's improvement, we will not proceed with epidural CSI at this time.  Could be considered in the future if symptoms do not improve.    Pertinent previous records reviewed include C-spine MRI 04/12/2023   I connected with patient by Doximity video enabled telemedicine application and verified that I am speaking with the correct person using two identifiers. Patient agreed to proceed via telephone. I discussed the limitations of evaluation and management by telemedicine and the availability of in person appointments. The patient expressed understanding and agreed to proceed.  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   Follow Up: As needed.  Could consider epidural CSI ideally at C5-C6, however if injection cannot be done at this level, would consider C6/C7   Subjective:     Chief Complaint: neck pain    HPI:    03/23/23 Patient is a 57 year old female complaining of neck pain. Patient states  that she has a very stiff neck for about 2 months. Muscle relaxer's and stretching don't help much. She has a constant dull headache. No numbness or tingling.  No MOI. Ibu doesn't help with the pain. Decreased ROM due to tightness    04/09/2023 Patient states that her pain is the same. meds didn't work . Constant dull headache. Isnt able to sleep , decreased ROM .   04/23/2023 Patient states pain is significantly improved after completing prednisone course.  Wants to discuss MRI results.   Relevant Historical Information: COPD, GERD Additional pertinent review of systems negative.  Additional pertinent review of systems negative.   Current Outpatient Medications:    albuterol (VENTOLIN HFA) 108 (90 Base) MCG/ACT inhaler, Inhale 2 puffs into the lungs every 6 (six) hours as needed for wheezing or shortness of breath., Disp: 8 g, Rfl: 5   cyclobenzaprine (FLEXERIL) 5 MG tablet, Take 1 tablet (5 mg total) by mouth 3 (three) times daily as needed for muscle spasms., Disp: 30 tablet, Rfl: 0   meloxicam (MOBIC) 15 MG tablet, Take 1 tablet (15 mg total) by mouth daily., Disp: 30 tablet, Rfl: 0   methylPREDNISolone (MEDROL DOSEPAK) 4 MG TBPK tablet, Take 6 tablets on day 1.  Take 5 tablets on day 2.  Take 4 tablets on day 3.  Take 3 tablets on day 4.  Take 2 tablets on day 5.  Take 1 tablet on day 6., Disp: 21 tablet, Rfl: 0   terbinafine (LAMISIL) 250 MG tablet, Take 1 tablet (250 mg total) by mouth daily., Disp: 14 tablet, Rfl: 0   Objective:    Alert and doing well.  Very pleasant over the phone  Electronically signed by:  Aleen Sells [facility] 8:06 AM 04/23/23

## 2023-06-03 DIAGNOSIS — L2989 Other pruritus: Secondary | ICD-10-CM | POA: Diagnosis not present

## 2023-06-03 DIAGNOSIS — Q828 Other specified congenital malformations of skin: Secondary | ICD-10-CM | POA: Diagnosis not present

## 2023-07-16 ENCOUNTER — Ambulatory Visit: Payer: Self-pay | Admitting: Family Medicine

## 2023-07-16 DIAGNOSIS — U071 COVID-19: Secondary | ICD-10-CM | POA: Diagnosis not present

## 2023-07-16 DIAGNOSIS — R509 Fever, unspecified: Secondary | ICD-10-CM | POA: Diagnosis not present

## 2023-07-16 NOTE — Telephone Encounter (Signed)
  Chief Complaint: headache Symptoms: Headache 8/10 pain x 2 days, chills, fatigue, congestion Frequency: Constant x2 days Pertinent Negatives: Patient denies Visual changes, injury, SOB, fever Disposition: [x] ED /[] Urgent Care (no appt availability in office) / [] Appointment(In office/virtual)/ []  [facility]/ [] Home Care/ [] Refused Recommended Disposition /[] [facility]/ []  Follow-up with PCP Additional Notes: Pt called stating she has had a severe headache 8/10 pain x 2 days. She states she has had a dull headache for 2 weeks, but is significantly worse the last two days. States she has chills, fatigue, congestion as well. Describes as "worst headache ever". Denies SOB, fever, injury. Per protocol, pt to be evaluated in ED. Pt agreeable to plan, states she is going to [employer]. Care advice reviewed with pt, pt denies further questions at this time. Alerting PCP for review.   Copied from CRM [id number]. Topic: Clinical - Red Word Triage >> Jul 16, 2023  8:23 AM Adele Barthel wrote: Red Word that prompted transfer to Nurse Triage: Pt describes symptoms that she believes are due to the flu. She notes having a headache that pain wise is rated at a 9/10. She has chills and unwell feeling. Reason for Disposition  [1] SEVERE headache (e.g., excruciating) AND [2] "worst headache" of life  Answer Assessment - Initial Assessment Questions 1. LOCATION: "Where does it hurt?"      All over, more in the front 2. ONSET: "When did the headache start?" (Minutes, hours or days)      2 days significant pain, 2 weeks dull headache 3. PATTERN: "Does the pain come and go, or has it been constant since it started?"     Constant 4. SEVERITY: "How bad is the pain?" and "What does it keep you from doing?"  (e.g., Scale 1-10; mild, moderate, or severe)   - MILD (1-3): doesn't interfere with normal activities    - MODERATE (4-7): interferes with normal activities or awakens from sleep    -  SEVERE (8-10): excruciating pain, unable to do any normal activities        8/10 5. RECURRENT SYMPTOM: "Have you ever had headaches before?" If Yes, ask: "When was the last time?" and "What happened that time?"      Denies, does not typically get headaches 6. CAUSE: "What do you think is causing the headache?"     Thinks she has the flu 7. MIGRAINE: "Have you been diagnosed with migraine headaches?" If Yes, ask: "Is this headache similar?"      Denies 8. HEAD INJURY: "Has there been any recent injury to the head?"      Denies 9. OTHER SYMPTOMS: "Do you have any other symptoms?" (fever, stiff neck, eye pain, sore throat, cold symptoms)     Congestion, chills, fatigue x 2 days  Protocols used: Headache-A-AH

## 2023-07-16 NOTE — Telephone Encounter (Signed)
FYI pt is being sent to the ER

## 2023-07-16 NOTE — Telephone Encounter (Signed)
Copied from CRM [id number]. Topic: Clinical - Red Word Triage >> Jul 16, 2023  8:23 AM Adele Barthel wrote: Red Word that prompted transfer to Nurse Triage: Pt describes symptoms that she believes are due to the flu. She notes having a headache that pain wise is rated at a 9/10. She has chills and unwell feeling.  Nurse attempted to call pt back to triage: no answer and unable to leave message due to voicemail full.

## 2023-08-17 ENCOUNTER — Other Ambulatory Visit: Payer: Self-pay

## 2023-08-17 ENCOUNTER — Emergency Department (HOSPITAL_BASED_OUTPATIENT_CLINIC_OR_DEPARTMENT_OTHER)
Admission: EM | Admit: 2023-08-17 | Discharge: 2023-08-17 | Disposition: A | Discharge status: Home / Self Care | Payer: BC Managed Care – PPO

## 2023-08-17 ENCOUNTER — Emergency Department (HOSPITAL_BASED_OUTPATIENT_CLINIC_OR_DEPARTMENT_OTHER): Payer: BC Managed Care – PPO

## 2023-08-17 ENCOUNTER — Encounter (HOSPITAL_BASED_OUTPATIENT_CLINIC_OR_DEPARTMENT_OTHER): Payer: Self-pay | Admitting: Emergency Medicine

## 2023-08-17 DIAGNOSIS — M79604 Pain in right leg: Secondary | ICD-10-CM | POA: Diagnosis not present

## 2023-08-17 DIAGNOSIS — Z720 Tobacco use: Secondary | ICD-10-CM | POA: Insufficient documentation

## 2023-08-17 DIAGNOSIS — I82561 Chronic embolism and thrombosis of right calf muscular vein: Secondary | ICD-10-CM | POA: Diagnosis not present

## 2023-08-17 DIAGNOSIS — M25511 Pain in right shoulder: Secondary | ICD-10-CM | POA: Insufficient documentation

## 2023-08-17 DIAGNOSIS — I825Y1 Chronic embolism and thrombosis of unspecified deep veins of right proximal lower extremity: Secondary | ICD-10-CM | POA: Diagnosis not present

## 2023-08-17 DIAGNOSIS — I809 Phlebitis and thrombophlebitis of unspecified site: Secondary | ICD-10-CM

## 2023-08-17 DIAGNOSIS — J449 Chronic obstructive pulmonary disease, unspecified: Secondary | ICD-10-CM | POA: Insufficient documentation

## 2023-08-17 DIAGNOSIS — I82591 Chronic embolism and thrombosis of other specified deep vein of right lower extremity: Secondary | ICD-10-CM | POA: Insufficient documentation

## 2023-08-17 DIAGNOSIS — Z7901 Long term (current) use of anticoagulants: Secondary | ICD-10-CM | POA: Insufficient documentation

## 2023-08-17 MED ORDER — NAPROXEN 250 MG PO TABS
500.0000 mg | ORAL_TABLET | Freq: Once | ORAL | Status: AC
  Administered 2023-08-17: 500 mg via ORAL
  Filled 2023-08-17: qty 2

## 2023-08-17 MED ORDER — METHOCARBAMOL 500 MG PO TABS: 500.0000 mg | ORAL_TABLET | Freq: Two times a day (BID) | ORAL | 0 refills | Status: AC

## 2023-08-17 MED ORDER — APIXABAN (ELIQUIS) VTE STARTER PACK (10MG AND 5MG): ORAL_TABLET | ORAL | 0 refills | Status: DC

## 2023-08-17 NOTE — ED Provider Notes (Signed)
[facility] Provider Note   CSN: [id number] Arrival date & time: 08/17/23  7829     History  Chief Complaint  Patient presents with   Leg Pain    Amber Zavala is a 58 y.o. female with a history of DVT, COPD, and tobacco use who presents the ED today for leg pain.  Patient reports pain to her right calf for the past 2 days.  No associated injury, recent travel, or surgery.  She does report a history of blood clot right calf in 2013.  She is not currently on anticoagulation.  Additionally, patient reports right shoulder pain for the past 2 days as well.  No injuries or trauma to the shoulder.  She maintains full range of motion of her arm and has intermittent spasms with movement.  No pain to the forearm or hand.  Has not tried any OTC analgesics.  She states that the pain feels similar as to when she had pleurisy 30 to 40 years ago.  Denies any associated chest pain or shortness of breath.  No additional complaints or concerns at this time.    Home Medications Prior to Admission medications   Medication Sig Start Date End Date Taking? Authorizing Provider  APIXABAN (ELIQUIS) VTE STARTER PACK (10MG  AND 5MG ) Take as directed on package: start with two-5mg  tablets twice daily for 7 days. On day 8, switch to one-5mg  tablet twice daily. 08/17/23  Yes Maxwell Marion, PA-C  methocarbamol (ROBAXIN) 500 MG tablet Take 1 tablet (500 mg total) by mouth 2 (two) times daily for 14 days. 08/17/23 08/31/23 Yes Maxwell Marion, PA-C  albuterol (VENTOLIN HFA) 108 (90 Base) MCG/ACT inhaler Inhale 2 puffs into the lungs every 6 (six) hours as needed for wheezing or shortness of breath. 03/05/21   Kuneff, Renee A, DO  cyclobenzaprine (FLEXERIL) 5 MG tablet Take 1 tablet (5 mg total) by mouth 3 (three) times daily as needed for muscle spasms. 03/23/23   Richardean Sale, DO  meloxicam (MOBIC) 15 MG tablet Take 1 tablet (15 mg total) by mouth daily. 03/23/23   Richardean Sale, DO  methylPREDNISolone (MEDROL DOSEPAK) 4 MG TBPK tablet Take 6 tablets on day 1.  Take 5 tablets on day 2.  Take 4 tablets on day 3.  Take 3 tablets on day 4.  Take 2 tablets on day 5.  Take 1 tablet on day 6. 04/09/23   Richardean Sale, DO  terbinafine (LAMISIL) 250 MG tablet Take 1 tablet (250 mg total) by mouth daily. 02/06/23   Kuneff, Renee A, DO      Allergies    Patient has no known allergies.    Review of Systems   Review of Systems  Musculoskeletal:        Right leg pain  All other systems reviewed and are negative.   Physical Exam Updated Vital Signs BP 128/88 (BP Location: Left Arm)   Pulse 83   Temp 97.7 F (36.5 C) (Oral)   Resp 17   Wt 59 kg   LMP 11/20/2014   SpO2 100%   BMI 19.77 kg/m  Physical Exam Vitals and nursing note reviewed.  Constitutional:      General: She is not in acute distress.    Appearance: Normal appearance.  HENT:     Head: Normocephalic and atraumatic.     Mouth/Throat:     Mouth: Mucous membranes are moist.  Eyes:     Conjunctiva/sclera: Conjunctivae normal.  Pupils: Pupils are equal, round, and reactive to light.  Cardiovascular:     Rate and Rhythm: Normal rate and regular rhythm.     Pulses: Normal pulses.     Heart sounds: Normal heart sounds.  Pulmonary:     Effort: Pulmonary effort is normal.     Breath sounds: Normal breath sounds.  Musculoskeletal:        General: Tenderness present. Normal range of motion.     Comments: Tenderness to palpation of the posterior right calf.  Palpable, indurated vein at right posterior calf.  Tenderness to palpation of the lateral proximal right humerus.  No tenderness to palpation of the shoulder.  Full range of motion of right shoulder appreciated.  Strength, sensation, and ROM of upper and lower extremities appreciated bilaterally.  Skin:    General: Skin is warm and dry.     Findings: No rash.  Neurological:     General: No focal deficit present.     Mental Status:  She is alert.     Sensory: No sensory deficit.     Motor: No weakness.  Psychiatric:        Mood and Affect: Mood normal.        Behavior: Behavior normal.    ED Results / Procedures / Treatments   Labs (all labs ordered are listed, but only abnormal results are displayed) Labs Reviewed - No data to display  EKG None  Radiology US Venous Img Lower Unilateral Right Result Date: 08/17/2023 CLINICAL DATA:  58 year old female with right leg pain for 2 days, history of deep vein thrombosis. EXAM: RIGHT LOWER EXTREMITY VENOUS DOPPLER ULTRASOUND TECHNIQUE: Gray-scale sonography with graded compression, as well as color Doppler and duplex ultrasound were performed to evaluate the right lower extremity deep venous systems from the level of the common femoral vein and including the common femoral, femoral, profunda femoral, popliteal and calf veins including the posterior tibial, peroneal and gastrocnemius veins when visible. Spectral Doppler was utilized to evaluate flow at rest and with distal augmentation maneuvers in the common femoral, femoral and popliteal veins. The contralateral common femoral vein was also evaluated for comparison. COMPARISON:  None Available. FINDINGS: RIGHT LOWER EXTREMITY Common Femoral Vein: No evidence of thrombus. Normal compressibility, respiratory phasicity and response to augmentation. Central Greater Saphenous Vein: No evidence of thrombus. Normal compressibility and flow on color Doppler imaging. Central Profunda Femoral Vein: No evidence of thrombus. Normal compressibility and flow on color Doppler imaging. Femoral Vein: No evidence of thrombus. Normal compressibility, respiratory phasicity and response to augmentation. Popliteal Vein: No evidence of thrombus. Normal compressibility, respiratory phasicity and response to augmentation. Calf Veins: The posterior tibial and peroneal veins are patent. There is focal, heterogeneously isoechoic thrombus in the gastrocnemius  vein. Other Findings: Noncompressible, minimally expansile, heterogeneous thrombus is visualized in the small saphenous vein. LEFT LOWER EXTREMITY Common Femoral Vein: No evidence of thrombus. Normal compressibility, respiratory phasicity and response to augmentation. IMPRESSION: 1. Subacute to chronic appearing focal deep vein thrombosis isolated to the right gastrocnemius vein. 2. Superficial thrombophlebitis about the right small saphenous vein. No evidence of surrounding suppuration. Marliss Coots, MD [facility] Electronically Signed   By: Marliss Coots M.D.   On: 08/17/2023 11:44   DG Shoulder Right Result Date: 08/17/2023 CLINICAL DATA:  Several day history of right shoulder pain EXAM: RIGHT SHOULDER - 3 VIEW COMPARISON:  None Available. FINDINGS: There is no evidence of fracture or dislocation. There is no evidence of  arthropathy or other focal bone abnormality. Soft tissues are unremarkable. IMPRESSION: No acute fracture or dislocation. Electronically Signed   By: Agustin Cree M.D.   On: 08/17/2023 11:25    Procedures Procedures: not indicated.   Medications Ordered in ED Medications  naproxen (NAPROSYN) tablet 500 mg (500 mg Oral Given 08/17/23 1650)    ED Course/ Medical Decision Making/ A&P                                 Medical Decision Making Amount and/or Complexity of Data Reviewed Radiology: ordered.  Risk Prescription drug management.   This patient presents to the ED for concern of right calf pain, this involves an extensive number of treatment options, and is a complaint that carries with it a high risk of complications and morbidity.   Differential diagnosis includes: SVT, DVT, phlebitis, muscle strain, radiculopathy, fracture, dislocation, contusion, etc.   Comorbidities  See HPI above   Additional History  Additional history obtained from prior records.   Imaging Studies  I ordered imaging  studies including right shoulder x-ray and right lower extremity venous ultrasound I independently visualized and interpreted imaging which showed:  No acute fracture or dislocation noted on right shoulder x-ray Subacute to chronic appearing focal deep vein thrombosis isolated to right gastrocnemius vein.  Superficial thrombophlebitis about the right small saphenous vein. I agree with the radiologist interpretation   Problem List / ED Course / Critical Interventions / Medication Management  Right calf pain and right proximal lateral humerus pain x 2 days I ordered medications including: Naproxen for pain - given prior to discharge  Discussed findings with patient.  All questions were answered.  Prescription for Eliquis sent to the pharmacy as well as Robaxin for most likely muscle spasms her right shoulder.  Instructed patient to use NSAID therapy for phlebitis. Information for hematology provided for patient to follow-up with. I have reviewed the patients home medicines and have made adjustments as needed   Social Determinants of Health  Tobacco use   Test / Admission - Considered  Patient is hemodynamically stable and safe for discharge home. Return precautions provided.       Final Clinical Impression(s) / ED Diagnoses Final diagnoses:  Chronic deep vein thrombosis (DVT) of other vein of right lower extremity (HCC)  Phlebitis    Rx / DC Orders ED Discharge Orders          Ordered    APIXABAN (ELIQUIS) VTE STARTER PACK (10MG  AND 5MG )       Note to Pharmacy: If starter pack unavailable, substitute with seventy-four 5 mg apixaban tabs following the above SIG directions.   08/17/23 1653    methocarbamol (ROBAXIN) 500 MG tablet  2 times daily        08/17/23 1656              Maxwell Marion, PA-C 08/17/23 1725    Coral Spikes, DO 08/17/23 2225

## 2023-08-17 NOTE — ED Notes (Signed)
RN provided AVS using Teachback Method. Patient verbalizes understanding of Discharge Instructions. Opportunity for Questioning and Answers were provided by RN. Patient Discharged from ED ambulatory to home with family. ? ?

## 2023-08-17 NOTE — Discharge Instructions (Addendum)
Take Eliquis 10 mg (2 5 mg tablets) for 7 days then taken 5 mg twice a day. Additionally, you can alternate between Ibuprofen and Tylenol every 4 hours as needed for phlebitis of your right calf. NSAIDs, like Ibuprofen can also help with the arm pain as well.  Ped information for hematology.  Please give them a call to schedule follow-up for further evaluation of your DVT.  I have sent a muscle relaxer to the pharmacy that you can take twice a day as needed for shoulder pain. This medication can cause drowsiness, so do not drive or operate heavy machinery while taking this medication.  Follow-up with your primary care provider in the next week for reevaluation of your arm pain.  Get help right away if: You have signs or symptoms that a blood clot has moved to the lungs. These may include: Shortness of breath. Chest pain. Fast or irregular heartbeats (palpitations). Light-headedness, dizziness, or fainting. Coughing up blood. You have signs or symptoms that your blood is too thin. These may include: Blood in your vomit, stool, or urine. A cut that will not stop bleeding. A menstrual period that is heavier than usual. A severe headache or confusion.

## 2023-08-17 NOTE — ED Triage Notes (Signed)
Rt leg pain x 2 days , no injury . Hx DVT .  Denies chest pain or shortness of breath   Rt shoulder pain , intact ROM

## 2023-08-25 NOTE — Progress Notes (Unsigned)
    Aleen Sells D.[facility] [address] [place] [address] Phone: [phone]   Assessment and Plan:     There are no diagnoses linked to this encounter.  ***   Pertinent previous records reviewed include ***    Follow Up: ***     Subjective:   I, Zyree Traynham, am serving as a Neurosurgeon for Doctor Richardean Sale  Chief Complaint: neck pain    HPI:    03/23/23 Patient is a 58 year old female complaining of neck pain. Patient states  that she has a very stiff neck for about 2 months. Muscle relaxer's and stretching don't help much. She has a constant dull headache. No numbness or tingling. No MOI. Ibu doesn't help with the pain. Decreased ROM due to tightness    04/09/2023 Patient states that her pain is the same. meds didn't work . Constant dull headache. Isnt able to sleep , decreased ROM .   04/23/2023 Patient states pain is significantly improved after completing prednisone course.  Wants to discuss MRI results.  08/26/2023 Patient states  Relevant Historical Information: COPD, GERD Additional pertinent review of systems negative.  Additional pertinent review of systems negative.   Current Outpatient Medications:    albuterol (VENTOLIN HFA) 108 (90 Base) MCG/ACT inhaler, Inhale 2 puffs into the lungs every 6 (six) hours as needed for wheezing or shortness of breath., Disp: 8 g, Rfl: 5   APIXABAN (ELIQUIS) VTE STARTER PACK (10MG  AND 5MG ), Take as directed on package: start with two-5mg  tablets twice daily for 7 days. On day 8, switch to one-5mg  tablet twice daily., Disp: 74 each, Rfl: 0   cyclobenzaprine (FLEXERIL) 5 MG tablet, Take 1 tablet (5 mg total) by mouth 3 (three) times daily as needed for muscle spasms., Disp: 30 tablet, Rfl: 0   meloxicam (MOBIC) 15 MG tablet, Take 1 tablet (15 mg total) by mouth daily., Disp: 30 tablet, Rfl: 0   methocarbamol (ROBAXIN) 500 MG tablet, Take 1 tablet (500 mg total) by mouth 2 (two)  times daily for 14 days., Disp: 28 tablet, Rfl: 0   methylPREDNISolone (MEDROL DOSEPAK) 4 MG TBPK tablet, Take 6 tablets on day 1.  Take 5 tablets on day 2.  Take 4 tablets on day 3.  Take 3 tablets on day 4.  Take 2 tablets on day 5.  Take 1 tablet on day 6., Disp: 21 tablet, Rfl: 0   terbinafine (LAMISIL) 250 MG tablet, Take 1 tablet (250 mg total) by mouth daily., Disp: 14 tablet, Rfl: 0   Objective:     There were no vitals filed for this visit.    There is no height or weight on file to calculate BMI.    Physical Exam:    ***   Electronically signed by:  Aleen Sells [facility] 9:24 AM 08/25/23

## 2023-08-26 ENCOUNTER — Ambulatory Visit: Payer: BC Managed Care – PPO | Admitting: Sports Medicine

## 2023-08-26 VITALS — BP 130/80 | HR 77 | Ht 68.0 in | Wt 131.0 lb

## 2023-08-26 DIAGNOSIS — G8929 Other chronic pain: Secondary | ICD-10-CM | POA: Diagnosis not present

## 2023-08-26 DIAGNOSIS — M25511 Pain in right shoulder: Secondary | ICD-10-CM

## 2023-08-26 DIAGNOSIS — M4802 Spinal stenosis, cervical region: Secondary | ICD-10-CM

## 2023-08-26 DIAGNOSIS — M542 Cervicalgia: Secondary | ICD-10-CM | POA: Diagnosis not present

## 2023-08-26 MED ORDER — METHYLPREDNISOLONE 4 MG PO TBPK: ORAL_TABLET | ORAL | 0 refills | Status: DC

## 2023-08-26 NOTE — Patient Instructions (Signed)
Prednisone dos pak If no improvement in 2 weeks call and ask for an epidural we will order and they will reach out to you. You will follow up with [place] 2 weeks after epidural

## 2023-08-28 DIAGNOSIS — I82401 Acute embolism and thrombosis of unspecified deep veins of right lower extremity: Secondary | ICD-10-CM | POA: Diagnosis not present

## 2023-08-28 DIAGNOSIS — Z1330 Encounter for screening examination for mental health and behavioral disorders, unspecified: Secondary | ICD-10-CM | POA: Diagnosis not present

## 2023-11-26 NOTE — Progress Notes (Deleted)
    [name] D.[facility] [address] [place] [address] Phone: [phone]   Assessment and Plan:     There are no diagnoses linked to this encounter.  ***   Pertinent previous records reviewed include ***    Follow Up: ***     Subjective:   I, Conrad Zajkowski, am serving as a Neurosurgeon for Doctor Ulysees Gander  Chief Complaint: low back pain   HPI:  11/27/2023 Patient is a 58 year old female with low back pain. Patient states   Relevant Historical Information: ***  Additional pertinent review of systems negative.   Current Outpatient Medications:    albuterol  (VENTOLIN  HFA) 108 (90 Base) MCG/ACT inhaler, Inhale 2 puffs into the lungs every 6 (six) hours as needed for wheezing or shortness of breath., Disp: 8 g, Rfl: 5   APIXABAN  (ELIQUIS ) VTE STARTER PACK (10MG  AND 5MG ), Take as directed on package: start with two-5mg  tablets twice daily for 7 days. On day 8, switch to one-5mg  tablet twice daily., Disp: 74 each, Rfl: 0   cyclobenzaprine  (FLEXERIL ) 5 MG tablet, Take 1 tablet (5 mg total) by mouth 3 (three) times daily as needed for muscle spasms., Disp: 30 tablet, Rfl: 0   meloxicam  (MOBIC ) 15 MG tablet, Take 1 tablet (15 mg total) by mouth daily., Disp: 30 tablet, Rfl: 0   methylPREDNISolone  (MEDROL  DOSEPAK) 4 MG TBPK tablet, Take 6 tablets on day 1.  Take 5 tablets on day 2.  Take 4 tablets on day 3.  Take 3 tablets on day 4.  Take 2 tablets on day 5.  Take 1 tablet on day 6., Disp: 21 tablet, Rfl: 0   methylPREDNISolone  (MEDROL  DOSEPAK) 4 MG TBPK tablet, Take 6 tablets on day 1.  Take 5 tablets on day 2.  Take 4 tablets on day 3.  Take 3 tablets on day 4.  Take 2 tablets on day 5.  Take 1 tablet on day 6., Disp: 21 tablet, Rfl: 0   terbinafine  (LAMISIL ) 250 MG tablet, Take 1 tablet (250 mg total) by mouth daily., Disp: 14 tablet, Rfl: 0   Objective:     There were no vitals filed for this visit.    There is no height or  weight on file to calculate BMI.    Physical Exam:    ***   Electronically signed by:  Marshall Skeeter D.[facility] 7:47 AM 11/26/23

## 2023-11-27 ENCOUNTER — Ambulatory Visit: Admitting: Sports Medicine

## 2024-03-15 DIAGNOSIS — M79641 Pain in right hand: Secondary | ICD-10-CM | POA: Diagnosis not present

## 2024-03-15 DIAGNOSIS — S63501A Unspecified sprain of right wrist, initial encounter: Secondary | ICD-10-CM | POA: Diagnosis not present

## 2024-04-04 ENCOUNTER — Ambulatory Visit: Admitting: Family Medicine

## 2024-04-04 ENCOUNTER — Encounter: Payer: Self-pay | Admitting: Family Medicine

## 2024-04-04 ENCOUNTER — Ambulatory Visit: Payer: Self-pay

## 2024-04-04 VITALS — BP 105/71 | HR 83 | Temp 98.5°F | Ht 68.0 in | Wt 139.4 lb

## 2024-04-04 DIAGNOSIS — S50862A Insect bite (nonvenomous) of left forearm, initial encounter: Secondary | ICD-10-CM

## 2024-04-04 DIAGNOSIS — W57XXXA Bitten or stung by nonvenomous insect and other nonvenomous arthropods, initial encounter: Secondary | ICD-10-CM | POA: Diagnosis not present

## 2024-04-04 MED ORDER — CEPHALEXIN 500 MG PO CAPS: 500.0000 mg | ORAL_CAPSULE | Freq: Two times a day (BID) | ORAL | 0 refills | Status: AC

## 2024-04-04 MED ORDER — FLUTICASONE PROPIONATE 0.05 % EX CREA: TOPICAL_CREAM | Freq: Two times a day (BID) | CUTANEOUS | 0 refills | Status: AC

## 2024-04-04 NOTE — Telephone Encounter (Signed)
 No further action needed at this time. Pt scheduled.

## 2024-04-04 NOTE — Progress Notes (Addendum)
 OFFICE VISIT  04/04/2024  CC:  Chief Complaint  Patient presents with   Insect Bite    Onset 2 days ago; has only used anti-itch cream. Area is dark red and itchy    Patient is a 58 y.o. female who presents for spot on left forearm.  HPI: A few days ago she was working in the yard.  Does not recall seeing anything or feeling anything bite her but soon after developed an itchy round spot on her left forearm.  Over-the-counter topical anti-itch formula is not helping her. No fever, chills, malaise, or bodyaches.  Past Medical History:  Diagnosis Date   COPD (chronic obstructive pulmonary disease) (HCC)    Dr. Jude   Dysplastic nevus 2012   trunk x3   GERD (gastroesophageal reflux disease)    Hot flashes    Hyperplastic colon polyp 2015   [facility]- [facility]   Palpitation    Periorbital cellulitis of right eye 03/30/2018   Sleep apnea    Dr. Jude   Tobacco abuse     Past Surgical History:  Procedure Laterality Date   APPENDECTOMY  1979   BILATERAL TEMPOROMANDIBULAR JOINT ARTHROPLASTY     BREAST ENHANCEMENT SURGERY     CERVICAL BIOPSY  W/ LOOP ELECTRODE EXCISION  09/2001   COLONOSCOPY W/ BIOPSIES  2015   hyperplastic polyp x1. 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 8 g 5   APIXABAN  (ELIQUIS ) VTE STARTER PACK (10MG  AND 5MG ) Take as directed on package: start with two-5mg  tablets twice daily for 7 days. On day 8, switch to one-5mg  tablet twice daily. (Patient not taking: Reported on 04/04/2024) 74 each 0   cyclobenzaprine  (FLEXERIL ) 5 MG tablet Take 1 tablet (5 mg total) by mouth 3 (three) times daily as needed for muscle spasms. 30 tablet 0   meloxicam  (MOBIC ) 15 MG tablet Take 1 tablet (15 mg  total) by mouth daily. 30 tablet 0   methylPREDNISolone  (MEDROL  DOSEPAK) 4 MG TBPK tablet Take 6 tablets on day 1.  Take 5 tablets on day 2.  Take 4 tablets on day 3.  Take 3 tablets on day 4.  Take 2 tablets on day 5.  Take 1 tablet on day 6. 21 tablet 0   methylPREDNISolone  (MEDROL  DOSEPAK) 4 MG TBPK tablet Take 6 tablets on day 1.  Take 5 tablets on day 2.  Take 4 tablets on day 3.  Take 3 tablets on day 4.  Take 2 tablets on day 5.  Take 1 tablet on day 6. 21 tablet 0   terbinafine  (LAMISIL ) 250 MG tablet Take 1 tablet (250 mg total) by mouth daily. 14 tablet 0   No facility-administered medications prior to visit.    No Known Allergies  Review of Systems  As per HPI  PE:    04/04/2024   11:13 AM 08/26/2023    3:52 PM 08/17/2023    2:50 PM  Vitals with BMI  Height 5' 8 5' 8   Weight 139 lbs 6 oz 131 lbs   BMI 21.2 19.92   Systolic 105 130 871  Diastolic 71 80 88  Pulse 83 77 83  Physical Exam  Volar aspect of left forearm with 1 cm oval pink lesion that is slightly palpable and has some subcutaneous induration.  No tenderness to palpation.  No vesicles.  No surrounding erythema or streaking.  LABS:  Last CBC Lab Results  Component Value Date   WBC 4.4 08/09/2020   HGB 15.3 (H) 08/09/2020   HCT 44.9 08/09/2020   MCV 95.1 08/09/2020   MCH 31.6 11/28/2014   RDW 13.1 08/09/2020   PLT 232.0 08/09/2020   Last metabolic panel Lab Results  Component Value Date   GLUCOSE 86 08/09/2020   NA 139 08/09/2020   K 4.5 08/09/2020   CL 102 08/09/2020   CO2 31 08/09/2020   BUN 14 08/09/2020   CREATININE 0.83 08/09/2020   GFR 80.32 08/09/2020   CALCIUM 10.0 08/09/2020   PROT 7.5 08/09/2020   ALBUMIN 4.5 08/09/2020   BILITOT 0.8 08/09/2020   ALKPHOS 60 08/09/2020   AST 19 08/09/2020   ALT 17 08/09/2020   IMPRESSION AND PLAN:  Left arm insect bite, possibly infected. Keflex  500 mg twice daily x 7 days. Cutivate  cream 0.05%, apply twice daily to affected area.  An  After Visit Summary was printed and given to the patient.  FOLLOW UP: Return if symptoms worsen or fail to improve.  Signed:  Gerlene Hockey, MD           04/04/2024

## 2024-04-04 NOTE — Telephone Encounter (Signed)
 FYI Only or Action Required?: FYI only for provider.  Patient was last seen in primary care on 02/27/2023 by Catherine Fuller A, DO.  Called Nurse Triage reporting Insect Bite.  Symptoms began several days ago.  Interventions attempted: OTC medications: anti itch cream.  Symptoms are: gradually worsening.  Triage Disposition: See Physician Within 24 Hours  Patient/caregiver understands and will follow disposition?: Yes   Copied from CRM #[id number]. Topic: Clinical - Red Word Triage >> Apr 04, 2024  9:03 AM Rea ORN wrote: Red Word that prompted transfer to Nurse Triage: Pt thinks she was bitten by a spider bite over weekend, swollen, red and ichy on left arm. Reason for Disposition  [1] Red or very tender (to touch) area AND [2] started over 24 hours after the bite  Answer Assessment - Initial Assessment Questions 1. TYPE of INSECT: What type of insect was it?      Unsure spider 2. ONSET: When did you get bitten?      Saturday  3. LOCATION: Where is the insect bite located?      Left arm 4. REDNESS: Is the area red or pink? If Yes, ask: What size is the area of redness? (inches or cm). When did the redness start?     red 5. PAIN: Is there any pain? If Yes, ask: How bad is the pain? (Scale 0-10; or none, mild, moderate, severe)     tender 6. ITCHING: Does it itch? If Yes, ask: How bad is the itch?      itchy 7. SWELLING: How big is the swelling? (e.g., inches, cm, or compare to coins)     yes 8. OTHER SYMPTOMS: Do you have any other symptoms?  (e.g., difficulty breathing, fever, hives)     Looks fluid filled  Protocols used: Insect Bite-A-AH

## 2024-05-02 NOTE — Progress Notes (Unsigned)
 [facility] [address] [place] [address] Phone: [phone]   Assessment and Plan:     1. Right carpal tunnel syndrome (Primary) -Chronic with exacerbation, initial visit - 4 to 5 months of right hand numbness and tingling that is intermittent throughout the day.  Most consistent with carpal tunnel - Patient does have moderate right sided spinal stenosis at C5-6 as seen on MRI from 04/12/2023, however today's presenting symptoms are more consistent with carpal tunnel.  If no improvement with carpal tunnel treatment, could consider cervical radiculopathy as primary pathology - Start prednisone  Dosepak - Start HEP for carpal tunnel  2. Hamstring strain, right, initial encounter -Chronic with exacerbation, initial visit - Unclear etiology of burning sensation through right hamstring.  Patient has no low back pain, no gluteal muscle pain, no knee pain.  Pain is isolated to mid hamstring and is not aggravated by hamstring activation.  Not consistent with meralgia paresthetica based on location of burning.  Will initiate conservative therapy and can consider further workup with x-ray versus ultrasound at follow-up visit if no improvement - Start HEP for hamstring - Start prednisone  Dosepak  15 additional minutes spent for educating Therapeutic Home Exercise Program.  This included exercises focusing on stretching, strengthening, with focus on eccentric aspects.   Long term goals include an improvement in range of motion, strength, endurance as well as avoiding reinjury. Patient's frequency would include in 1-2 times a day, 3-5 times a week for a duration of 6-12 weeks. Proper technique shown and discussed handout in great detail with ATC.  All questions were discussed and answered.      Pertinent previous records reviewed include none   Follow Up: 2 to 3 weeks for reevaluation.  If no improvement or worsening of symptoms, could  consider carpal tunnel CSI.  Could consider x-ray right hip and lumbar spine   Subjective:   I, Amber Zavala, am serving as a Neurosurgeon for Doctor Morene Mace   Chief Complaint: neck pain    HPI:    03/23/23 Patient is a 58 year old female complaining of neck pain. Patient states  that she has a very stiff neck for about 2 months. Muscle relaxer's and stretching don't help much. She has a constant dull headache. No numbness or tingling. No MOI. Ibu doesn't help with the pain. Decreased ROM due to tightness    04/09/2023 Patient states that her pain is the same. meds didn't work . Constant dull headache. Isnt able to sleep , decreased ROM .   04/23/2023 Patient states pain is significantly improved after completing prednisone  course.  Wants to discuss MRI results.   08/26/2023 Patient states if she turns her head a certain way she gets a jolting pain in her shoulder down her arm. This has been going on for a few weeks. 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  Patient reports pain to her right calf for the past 2 days.  No associated injury, recent travel, or surgery.  She does report a history of blood clot right calf in 2013.  She is not currently on anticoagulation.  Additionally, patient reports right shoulder pain for the past 2 days as well.  No injuries or trauma to the shoulder.  She maintains full range of motion of her arm and has intermittent spasms with movement.  No pain to the forearm  or hand.  Has not tried any OTC analgesics.  She states that the pain feels similar as to when she had pleurisy 30 to 40 years ago.  Denies any associated chest pain or shortness of breath.  No additional complaints or concerns at this time.   05/03/2024 Patient states right hand fingertips are numb intermittently. She has burning sensation through the hamstring      Relevant Historical Information: COPD, GERD    Additional pertinent review of systems negative.    Additional pertinent review of systems negative.   Current Outpatient Medications:    methylPREDNISolone  (MEDROL  DOSEPAK) 4 MG TBPK tablet, Take 6 tablets on day 1.  Take 5 tablets on day 2.  Take 4 tablets on day 3.  Take 3 tablets on day 4.  Take 2 tablets on day 5.  Take 1 tablet on day 6., Disp: 21 tablet, Rfl: 0   albuterol  (VENTOLIN  HFA) 108 (90 Base) MCG/ACT inhaler, Inhale 2 puffs into the lungs every 6 (six) hours as needed for wheezing or shortness of breath., Disp: 8 g, Rfl: 5   cephALEXin  (KEFLEX ) 500 MG capsule, Take 1 capsule (500 mg total) by mouth 2 (two) times daily., Disp: 14 capsule, Rfl: 0   fluticasone  (CUTIVATE ) 0.05 % cream, Apply topically 2 (two) times daily., Disp: 15 g, Rfl: 0   Objective:     Vitals:   05/03/24 1529  BP: 106/80  Pulse: 87  SpO2: 97%  Weight: 142 lb (64.4 kg)  Height: 5' 8 (1.727 m)      Body mass index is 21.59 kg/m.    Physical Exam:    General: Appears well, nad, nontoxic and pleasant Neuro:sensation intact, strength is 5/5 in upper extremities, muscle tone wnl Skin:no susupicious lesions or rashes  Right hand/Wrist:   No deformity or swelling appreciated. ROM  Ext 90, flexion 70, radial/ulnar deviation 30 nttp over the snuff box, dorsal carpals, volar carpals, radial styloid, ulnar styloid, 1st mcp, tfcc Positive Tinel's, Phalen's, Prayer Tests Negative finklestein Neg tfcc bounce test No pain with resisted ext, flex or deviation   General: awake, alert, and oriented no acute distress, nontoxic Skin: no suspicious lesions or rashes Neuro:sensation intact distally with no deficits, normal muscle tone, no atrophy, strength 5/5 in all tested lower ext groups Psych: normal mood and affect, speech clear   Right hip: No deformity, swelling or wasting ROM Flexion 90, ext 30, IR 45, ER 45 NTTP over the hip flexors, greater trochanter, gluteal musculature, si joint, lumbar  spine Negative log roll with FROM Negative FABER Negative FADIR Negative Piriformis test Negative trendelenberg Gait normal  No pain with single-leg or double leg squat No pain with resisted hamstring contraction  Electronically signed by:  Odis Mace D.[facility]MENTE[facility] 3:47 PM 05/03/24

## 2024-05-03 ENCOUNTER — Ambulatory Visit: Admitting: Sports Medicine

## 2024-05-03 VITALS — BP 106/80 | HR 87 | Ht 68.0 in | Wt 142.0 lb

## 2024-05-03 DIAGNOSIS — G5601 Carpal tunnel syndrome, right upper limb: Secondary | ICD-10-CM

## 2024-05-03 DIAGNOSIS — S76311A Strain of muscle, fascia and tendon of the posterior muscle group at thigh level, right thigh, initial encounter: Secondary | ICD-10-CM | POA: Diagnosis not present

## 2024-05-03 MED ORDER — METHYLPREDNISOLONE 4 MG PO TBPK: ORAL_TABLET | ORAL | 0 refills | Status: AC

## 2024-05-03 NOTE — Patient Instructions (Addendum)
 Carpal tunnel HEP   Prednisone  dos pak  2-3 week follow up

## 2024-07-05 DIAGNOSIS — Z1151 Encounter for screening for human papillomavirus (HPV): Secondary | ICD-10-CM | POA: Diagnosis not present

## 2024-07-05 DIAGNOSIS — Z124 Encounter for screening for malignant neoplasm of cervix: Secondary | ICD-10-CM | POA: Diagnosis not present

## 2024-07-05 DIAGNOSIS — Z130 Encounter for screening for diseases of the blood and blood-forming organs and certain disorders involving the immune mechanism: Secondary | ICD-10-CM | POA: Diagnosis not present

## 2024-07-05 DIAGNOSIS — Z1231 Encounter for screening mammogram for malignant neoplasm of breast: Secondary | ICD-10-CM | POA: Diagnosis not present

## 2024-07-05 DIAGNOSIS — Z01419 Encounter for gynecological examination (general) (routine) without abnormal findings: Secondary | ICD-10-CM | POA: Diagnosis not present

## 2024-07-07 ENCOUNTER — Telehealth: Payer: Self-pay

## 2024-07-07 NOTE — Telephone Encounter (Signed)
 Communication  Reason for CRM: patient called wanting to know who did her last colonoscopy from ten years ago because she trying to schedule another one and they need the last one. I did look in her chart and told her it was a female named MD Brodi but she said it was a female  [phone]   Called and spoke with pt. Advised pt of who performed her last colonoscopy. Pt stated she had been referred to a new gastroenterologist and was needing a copy of her last colonoscopy. Results printed and emailed per pt request.

## 2024-07-11 DIAGNOSIS — R079 Chest pain, unspecified: Secondary | ICD-10-CM | POA: Diagnosis not present

## 2024-07-11 DIAGNOSIS — Z7901 Long term (current) use of anticoagulants: Secondary | ICD-10-CM | POA: Diagnosis not present

## 2024-07-11 DIAGNOSIS — R0789 Other chest pain: Secondary | ICD-10-CM | POA: Diagnosis not present

## 2024-07-11 DIAGNOSIS — R9431 Abnormal electrocardiogram [ECG] [EKG]: Secondary | ICD-10-CM | POA: Diagnosis not present

## 2024-07-11 DIAGNOSIS — F17210 Nicotine dependence, cigarettes, uncomplicated: Secondary | ICD-10-CM | POA: Diagnosis not present

## 2024-07-12 DIAGNOSIS — R079 Chest pain, unspecified: Secondary | ICD-10-CM | POA: Diagnosis not present
